# Patient Record
Sex: Male | Born: 1955 | Race: White | Hispanic: No
Health system: Midwestern US, Community
[De-identification: ages and names within clinical notes are randomized; demographics above are authoritative.]

## PROBLEM LIST (undated history)

## (undated) DIAGNOSIS — R1011 Right upper quadrant pain: Secondary | ICD-10-CM

## (undated) DIAGNOSIS — Z01818 Encounter for other preprocedural examination: Secondary | ICD-10-CM

---

## 2015-09-08 ENCOUNTER — Ambulatory Visit: Admit: 2015-09-08 | Discharge: 2015-09-08 | Payer: PRIVATE HEALTH INSURANCE | Attending: Surgery | Primary: Specialist

## 2015-09-08 DIAGNOSIS — R1011 Right upper quadrant pain: Secondary | ICD-10-CM

## 2015-09-08 NOTE — Progress Notes (Signed)
1. Have you been to the ER, urgent care clinic since your last visit?  Hospitalized since your last visit?  [facility] ED for abd pain    2. Have you seen or consulted any other health care providers outside of the [facility] since your last visit?  Include any pap smears or colon screening.   no

## 2015-09-08 NOTE — Progress Notes (Signed)
[facility] History and Physical    History of Present Illness:      John [name] is a 60 y.o. male who has been having some RUQ abdominal pain.  The pain is worse after eating.  Sometimes worse after a big meal.  The pain is a 4 of 10.  He does have some nausea but no vomiting.  He has been a little more constipated recently.  He has been to the ED at [facility] 2 times over the past 2 weeks.  His work up each time was completely normal, the only thing that was seen is the small bowel was in the area of the liver a little higher than it normally is but had no signs of obstruction or masses.    Past Medical History   Diagnosis Date   ??? Hypertension        No past surgical history on file.      Current Outpatient Prescriptions:   ???  lisinopril (PRINIVIL, ZESTRIL) 20 mg tablet, TAKE 1 TABLET BY MOUTH EVERY DAY, Disp: , Rfl: 11  ???  AMBIEN CR 12.5 mg tablet, TAKE 1 TABLET BY MOUTH AT BEDTIME AS NEEDED, Disp: , Rfl: 3  ???  atorvastatin (LIPITOR) 40 mg tablet, TAKE 1 TABLET EVERY DAY, Disp: , Rfl: 3  ???  BELSOMRA 10 mg tablet, TAKE 1 TABLET AT BEDTIME FOR INSOMNIA, Disp: , Rfl: 0    No Known Allergies    Social History     Social History   ??? Marital status: MARRIED     Spouse name: N/A   ??? Number of children: N/A   ??? Years of education: N/A     Occupational History   ??? Not on file.     Social History Main Topics   ??? Smoking status: Current Every Day Smoker   ??? Smokeless tobacco: Not on file   ??? Alcohol use Yes   ??? Drug use: Not on file   ??? Sexual activity: Not on file     Other Topics Concern   ??? Not on file     Social History Narrative       Family History   Problem Relation Age of Onset   ??? Cancer Father    ??? Cancer Maternal Grandmother    ??? Heart Disease Maternal Grandmother        ROS   Constitutional: negative  Ears, Nose, Mouth, Throat, and Face: negative  Respiratory: negative  Cardiovascular: negative  Gastrointestinal: positive for nausea, constipation and abdominal pain  Genitourinary:negative   Integument/Breast: negative  Hematologic/Lymphatic: negative  Behavioral/Psychiatric: negative  Allergic/Immunologic: negative      Physical Exam:     Visit Vitals   ??? BP 126/70   ??? Pulse 79   ??? Temp 97.9 ??F (36.6 ??C)   ??? Resp 16   ??? Ht 6\' 2"  (1.88 m)   ??? Wt 198 lb (89.8 kg)   ??? SpO2 98%   ??? BMI 25.42 kg/m2       General - alert and oriented, no apparent distress  HEENT - no jaundice, no hearing imparement  Pulm - CTAB, no C/W/R  CV - RRR, no M/R/G  Abd - soft, ND, BS present, mild TTP in RUQ, no guarding  Ext - pulses intact in UE and LE bilaterally, no edema  Skin - supple, no rashes  Psychiatric - normal affect, good mood    Labs  none    Imaging  Images from 2  CTs at [facility] show no obstruction, normal small bowel but some small bowel in the RUQ, no stones in the GB  I have reviewed and agree with all of the pertinent images    Assessment:     John [name] is a 60 y.o. male with RUQ pain    Recommendations:     1.   The pain he is having could be from biliary dyskinesia.  I do not think the pain is related to the small bowel in the RUQ.  This is an abnormal location to see it but the bowel is normal in appearance, no obstruction, inflammation or signs of twisting.  His pain seems to be possibly biliary in origin with pain with eating.  He will get a HIDA with [facility] to see his GB EF.  If it is abnormal he will then get laparoscopic cholecystectomy.  If normal then referral to GI.  I will call him with the results of the HIDA.    Debbe Bales, MD    Mr. John [name] has a reminder for a "due or due soon" health maintenance. I have asked that he contact his primary care provider for follow-up on this health maintenance.

## 2015-09-13 ENCOUNTER — Ambulatory Visit: Payer: PRIVATE HEALTH INSURANCE | Primary: Specialist

## 2015-09-27 ENCOUNTER — Encounter

## 2015-09-28 ENCOUNTER — Inpatient Hospital Stay: Admit: 2015-09-28 | Payer: PRIVATE HEALTH INSURANCE | Attending: Surgery | Primary: Specialist

## 2015-09-28 DIAGNOSIS — R1011 Right upper quadrant pain: Secondary | ICD-10-CM

## 2015-09-28 MED ORDER — SINCALIDE 5 MCG IJ SOLR
5 mcg | Freq: Once | INTRAMUSCULAR | Status: AC
Start: 2015-09-28 — End: 2015-09-28
  Administered 2015-09-28: 19:00:00 via INTRAVENOUS

## 2015-09-28 MED ORDER — SODIUM CHLORIDE 0.9 % IJ SYRG
Freq: Once | INTRAMUSCULAR | Status: AC
Start: 2015-09-28 — End: 2015-09-28
  Administered 2015-09-28: 18:00:00 via INTRAVENOUS

## 2015-09-28 MED ORDER — SODIUM CHLORIDE 0.9 % IV
Freq: Once | INTRAVENOUS | Status: AC
Start: 2015-09-28 — End: 2015-09-28
  Administered 2015-09-28: 19:00:00 via INTRAVENOUS

## 2015-09-28 MED ORDER — TECHNETIUM TC 99M MEBROFENIN
Freq: Once | Status: AC
Start: 2015-09-28 — End: 2015-09-28
  Administered 2015-09-28: 18:00:00 via INTRAVENOUS

## 2015-09-28 MED FILL — KINEVAC 5 MCG SOLUTION FOR INJECTION: 5 mcg | INTRAMUSCULAR | Qty: 5

## 2015-09-28 MED FILL — SODIUM CHLORIDE 0.9 % IV: INTRAVENOUS | Qty: 25

## 2015-09-28 MED FILL — TECHNETIUM TC 99M MEBROFENIN: Qty: 6

## 2015-09-28 MED FILL — BD POSIFLUSH NORMAL SALINE 0.9 % INJECTION SYRINGE: INTRAMUSCULAR | Qty: 10

## 2015-09-29 ENCOUNTER — Inpatient Hospital Stay: Admit: 2015-09-29 | Payer: PRIVATE HEALTH INSURANCE | Attending: Body Imaging | Primary: Specialist

## 2015-09-29 DIAGNOSIS — R1011 Right upper quadrant pain: Secondary | ICD-10-CM

## 2015-09-30 NOTE — Telephone Encounter (Signed)
Message given to Dr Lee.

## 2017-08-01 ENCOUNTER — Inpatient Hospital Stay: Admit: 2017-08-01 | Payer: PRIVATE HEALTH INSURANCE | Primary: Specialist

## 2017-08-01 ENCOUNTER — Encounter

## 2017-08-01 DIAGNOSIS — Z01818 Encounter for other preprocedural examination: Secondary | ICD-10-CM

## 2017-08-01 LAB — URINALYSIS W/MICROSCOPIC
Bacteria: NEGATIVE /hpf
Bilirubin: NEGATIVE
Blood: NEGATIVE
Glucose: NEGATIVE mg/dL
Ketone: NEGATIVE mg/dL
Leukocyte Esterase: NEGATIVE
Nitrites: NEGATIVE
Protein: NEGATIVE mg/dL
Specific gravity: 1.007 (ref 1.003–1.030)
Urobilinogen: 0.2 EU/dL (ref 0.2–1.0)
pH (UA): 6 (ref 5.0–8.0)

## 2017-08-01 LAB — CBC WITH AUTOMATED DIFF
ABS. BASOPHILS: 0 10*3/uL (ref 0.0–0.1)
ABS. EOSINOPHILS: 0.1 10*3/uL (ref 0.0–0.4)
ABS. IMM. GRANS.: 0 10*3/uL (ref 0.00–0.04)
ABS. LYMPHOCYTES: 1.3 10*3/uL (ref 0.8–3.5)
ABS. MONOCYTES: 0.5 10*3/uL (ref 0.0–1.0)
ABS. NEUTROPHILS: 3.1 10*3/uL (ref 1.8–8.0)
ABSOLUTE NRBC: 0 10*3/uL (ref 0.00–0.01)
BASOPHILS: 1 % (ref 0–1)
EOSINOPHILS: 2 % (ref 0–7)
HCT: 39.4 % (ref 36.6–50.3)
HGB: 12.5 g/dL (ref 12.1–17.0)
IMMATURE GRANULOCYTES: 0 % (ref 0.0–0.5)
LYMPHOCYTES: 26 % (ref 12–49)
MCH: 30.3 PG (ref 26.0–34.0)
MCHC: 31.7 g/dL (ref 30.0–36.5)
MCV: 95.4 FL (ref 80.0–99.0)
MONOCYTES: 10 % (ref 5–13)
MPV: 11.8 FL (ref 8.9–12.9)
NEUTROPHILS: 61 % (ref 32–75)
NRBC: 0 PER 100 WBC
PLATELET: 144 10*3/uL — ABNORMAL LOW (ref 150–400)
RBC: 4.13 M/uL (ref 4.10–5.70)
RDW: 13.1 % (ref 11.5–14.5)
WBC: 5 10*3/uL (ref 4.1–11.1)

## 2017-08-01 LAB — METABOLIC PANEL, COMPREHENSIVE
A-G Ratio: 1.6 (ref 1.1–2.2)
ALT (SGPT): 23 U/L (ref 12–78)
AST (SGOT): 11 U/L — ABNORMAL LOW (ref 15–37)
Albumin: 4.1 g/dL (ref 3.5–5.0)
Alk. phosphatase: 38 U/L — ABNORMAL LOW (ref 45–117)
Anion gap: 7 mmol/L (ref 5–15)
BUN/Creatinine ratio: 16 (ref 12–20)
BUN: 14 MG/DL (ref 6–20)
Bilirubin, total: 0.6 MG/DL (ref 0.2–1.0)
CO2: 30 mmol/L (ref 21–32)
Calcium: 9 MG/DL (ref 8.5–10.1)
Chloride: 104 mmol/L (ref 97–108)
Creatinine: 0.85 MG/DL (ref 0.70–1.30)
GFR est AA: >60 mL/min/{1.73_m2} (ref >60)
GFR est non-AA: >60 mL/min/{1.73_m2} (ref >60)
Globulin: 2.6 g/dL (ref 2.0–4.0)
Glucose: 99 mg/dL (ref 65–100)
Potassium: 3.9 mmol/L (ref 3.5–5.1)
Protein, total: 6.7 g/dL (ref 6.4–8.2)
Sodium: 141 mmol/L (ref 136–145)

## 2017-08-01 LAB — PTT: aPTT: 28.9 s (ref 22.1–32.0)

## 2017-08-01 LAB — PROTHROMBIN TIME + INR
INR: 1 (ref 0.9–1.1)
Prothrombin time: 10.3 s (ref 9.0–11.1)

## 2017-08-01 LAB — TYPE + CROSSMATCH
ABO/Rh(D): B NEG
Antibody screen: NEGATIVE

## 2017-08-01 MED ORDER — SODIUM CHLORIDE 0.9 % IV
INTRAVENOUS | Status: DC | PRN
Start: 2017-08-01 — End: 2017-08-05

## 2017-08-01 MED FILL — SODIUM CHLORIDE 0.9 % IV: INTRAVENOUS | Qty: 250

## 2017-08-01 NOTE — Other (Signed)
Patient given surgical site infection FAQ handout and CHG wipes. Preop instructions reviewed and patient verbalizes understanding of instructions. Patient has been given the opportunity to ask additional questions.

## 2017-08-02 LAB — CULTURE, URINE
Colonies Counted: <1000
Colony Count: <1000
Culture result:: NO GROWTH
Culture: NO GROWTH

## 2017-08-02 LAB — EKG 12-LEAD
Atrial Rate: 50 {beats}/min
P Axis: 29 degrees
P-R Interval: 204 ms
Q-T Interval: 426 ms
QRS Duration: 94 ms
QTc Calculation (Bazett): 388 ms
R Axis: 52 degrees
T Axis: 40 degrees
Ventricular Rate: 50 {beats}/min

## 2017-08-02 LAB — EKG, 12 LEAD, INITIAL
Atrial Rate: 50 {beats}/min
Calculated P Axis: 29 degrees
Calculated R Axis: 52 degrees
Calculated T Axis: 40 degrees
P-R Interval: 204 ms
Q-T Interval: 426 ms
QRS Duration: 94 ms
QTC Calculation (Bezet): 388 ms
Ventricular Rate: 50 {beats}/min

## 2017-08-02 NOTE — Other (Signed)
FAXED PRE-ADMISSION TESTING REPORTS (AND FAX CONFIRMATION RECEIVED TO DR. Maxie Barb OFFICE CALLED ON 08/02/17  AT 1322  AND SPOKE WITH SECRETARY WHO SAID THE PLATELET COUNT HAD BEEN SCANNED INTO PATIENTS MEDICAL RECORD , RE: ABNORMAL PLATELETS 144 (L).

## 2017-08-15 ENCOUNTER — Inpatient Hospital Stay
Admit: 2017-08-15 | Discharge: 2017-08-19 | Disposition: A | Discharge status: Home / Self Care | Payer: PRIVATE HEALTH INSURANCE | Attending: Urology | Admitting: Urology

## 2017-08-15 DIAGNOSIS — C642 Malignant neoplasm of left kidney, except renal pelvis: Secondary | ICD-10-CM

## 2017-08-15 LAB — HGB & HCT
HCT: 38.8 % (ref 36.6–50.3)
HGB: 12.3 g/dL (ref 12.1–17.0)

## 2017-08-15 MED ORDER — LACTATED RINGERS IV
INTRAVENOUS | Status: DC
Start: 2017-08-15 — End: 2017-08-15
  Administered 2017-08-15: 16:00:00 via INTRAVENOUS

## 2017-08-15 MED ORDER — CLONAZEPAM 1 MG TAB
1 mg | Freq: Every evening | ORAL | Status: DC
Start: 2017-08-15 — End: 2017-08-19
  Administered 2017-08-16 – 2017-08-18 (×2): via ORAL

## 2017-08-15 MED ORDER — SUGAMMADEX 100 MG/ML INTRAVENOUS SOLUTION
100 mg/mL | INTRAVENOUS | Status: DC
Start: 2017-08-15 — End: 2017-08-15
  Administered 2017-08-15: 16:00:00 via INTRAVENOUS

## 2017-08-15 MED ORDER — LINACLOTIDE 290 MCG CAPSULE
290 mcg | Freq: Every day | ORAL | Status: DC
Start: 2017-08-15 — End: 2017-08-19
  Administered 2017-08-17 – 2017-08-19 (×3): via ORAL

## 2017-08-15 MED ORDER — DEXAMETHASONE SODIUM PHOSPHATE 4 MG/ML IJ SOLN
4 mg/mL | INTRAMUSCULAR | Status: DC | PRN
Start: 2017-08-15 — End: 2017-08-15
  Administered 2017-08-15: 13:00:00 via INTRAVENOUS

## 2017-08-15 MED ORDER — ONDANSETRON (PF) 4 MG/2 ML INJECTION
4 mg/2 mL | INTRAMUSCULAR | Status: DC | PRN
Start: 2017-08-15 — End: 2017-08-15

## 2017-08-15 MED ORDER — ACETAMINOPHEN 1,000 MG/100 ML (10 MG/ML) IV
1000 mg/100 mL (10 mg/mL) | INTRAVENOUS | Status: DC | PRN
Start: 2017-08-15 — End: 2017-08-15
  Administered 2017-08-15: 13:00:00 via INTRAVENOUS

## 2017-08-15 MED ORDER — BUPIVACAINE (PF) 0.25 % (2.5 MG/ML) IJ SOLN
0.25 % (2.5 mg/mL) | INTRAMUSCULAR | Status: DC | PRN
Start: 2017-08-15 — End: 2017-08-15
  Administered 2017-08-15: 14:00:00 via SUBCUTANEOUS

## 2017-08-15 MED ORDER — PROPOFOL 10 MG/ML IV EMUL
10 mg/mL | INTRAVENOUS | Status: DC | PRN
Start: 2017-08-15 — End: 2017-08-15
  Administered 2017-08-15: 13:00:00 via INTRAVENOUS

## 2017-08-15 MED ORDER — HYDROMORPHONE (PF) 2 MG/ML IJ SOLN
2 mg/mL | INTRAMUSCULAR | Status: DC | PRN
Start: 2017-08-15 — End: 2017-08-15
  Administered 2017-08-15 (×2): via INTRAVENOUS

## 2017-08-15 MED ORDER — MIDAZOLAM 1 MG/ML IJ SOLN
1 mg/mL | INTRAMUSCULAR | Status: AC
Start: 2017-08-15 — End: ?

## 2017-08-15 MED ORDER — BACITRACIN 50,000 UNIT IM
50000 unit | INTRAMUSCULAR | Status: DC | PRN
Start: 2017-08-15 — End: 2017-08-15
  Administered 2017-08-15: 14:00:00

## 2017-08-15 MED ORDER — TRAZODONE 100 MG TAB
100 mg | Freq: Every evening | ORAL | Status: DC
Start: 2017-08-15 — End: 2017-08-15

## 2017-08-15 MED ORDER — SODIUM CHLORIDE 0.9 % IJ SYRG
INTRAMUSCULAR | Status: DC | PRN
Start: 2017-08-15 — End: 2017-08-15

## 2017-08-15 MED ORDER — CEFAZOLIN 2 GRAM/20 ML IN STERILE WATER INTRAVENOUS SYRINGE
2 gram/0 mL | INTRAVENOUS | Status: AC
Start: 2017-08-15 — End: 2017-08-15
  Administered 2017-08-15: 13:00:00 via INTRAVENOUS

## 2017-08-15 MED ORDER — MIDAZOLAM 1 MG/ML IJ SOLN
1 mg/mL | INTRAMUSCULAR | Status: DC | PRN
Start: 2017-08-15 — End: 2017-08-15

## 2017-08-15 MED ORDER — CLONAZEPAM 1 MG TAB
1 mg | Freq: Every evening | ORAL | Status: DC
Start: 2017-08-15 — End: 2017-08-15

## 2017-08-15 MED ORDER — FENTANYL CITRATE (PF) 50 MCG/ML IJ SOLN
50 mcg/mL | INTRAMUSCULAR | Status: DC | PRN
Start: 2017-08-15 — End: 2017-08-15
  Administered 2017-08-15 (×2): via INTRAVENOUS

## 2017-08-15 MED ORDER — LACTATED RINGERS IV
INTRAVENOUS | Status: DC
Start: 2017-08-15 — End: 2017-08-15
  Administered 2017-08-15: 12:00:00 via INTRAVENOUS

## 2017-08-15 MED ORDER — MANNITOL 25 % IV SOLN
25 % | INTRAVENOUS | Status: DC | PRN
Start: 2017-08-15 — End: 2017-08-15
  Administered 2017-08-15: 14:00:00 via INTRAVENOUS

## 2017-08-15 MED ORDER — SODIUM CHLORIDE 0.9 % IJ SYRG
Freq: Three times a day (TID) | INTRAMUSCULAR | Status: DC
Start: 2017-08-15 — End: 2017-08-19
  Administered 2017-08-15 – 2017-08-19 (×13): via INTRAVENOUS

## 2017-08-15 MED ORDER — HYDROMORPHONE (PF) 2 MG/ML IJ SOLN
2 mg/mL | INTRAMUSCULAR | Status: AC
Start: 2017-08-15 — End: ?

## 2017-08-15 MED ORDER — FENTANYL CITRATE (PF) 50 MCG/ML IJ SOLN
50 mcg/mL | INTRAMUSCULAR | Status: DC | PRN
Start: 2017-08-15 — End: 2017-08-15

## 2017-08-15 MED ORDER — PHENYLEPHRINE IN 0.9 % SODIUM CL (40 MCG/ML) IV SYRINGE
0.4 mg/10 mL (40 mcg/mL) | INTRAVENOUS | Status: DC | PRN
Start: 2017-08-15 — End: 2017-08-15
  Administered 2017-08-15 (×2): via INTRAVENOUS

## 2017-08-15 MED ORDER — PANTOPRAZOLE 40 MG GRANULES FOR ORAL SUSP, DELAYED RELEASE
40 mg | Freq: Every day | ORAL | Status: DC
Start: 2017-08-15 — End: 2017-08-15

## 2017-08-15 MED ORDER — TRAZODONE 50 MG TAB
50 mg | Freq: Every evening | ORAL | Status: DC
Start: 2017-08-15 — End: 2017-08-19
  Administered 2017-08-16 – 2017-08-19 (×5): via ORAL

## 2017-08-15 MED ORDER — SODIUM CHLORIDE 0.9 % IJ SYRG
Freq: Three times a day (TID) | INTRAMUSCULAR | Status: DC
Start: 2017-08-15 — End: 2017-08-15
  Administered 2017-08-15 (×2): via INTRAVENOUS

## 2017-08-15 MED ORDER — NALOXONE 0.4 MG/ML INJECTION
0.4 mg/mL | INTRAMUSCULAR | Status: DC | PRN
Start: 2017-08-15 — End: 2017-08-19

## 2017-08-15 MED ORDER — GLYCOPYRROLATE 0.2 MG/ML IJ SOLN
0.2 mg/mL | INTRAMUSCULAR | Status: DC | PRN
Start: 2017-08-15 — End: 2017-08-15
  Administered 2017-08-15: 13:00:00 via INTRAVENOUS

## 2017-08-15 MED ORDER — SUCCINYLCHOLINE CHLORIDE 20 MG/ML INJECTION
20 mg/mL | INTRAMUSCULAR | Status: DC | PRN
Start: 2017-08-15 — End: 2017-08-15
  Administered 2017-08-15: 13:00:00 via INTRAVENOUS

## 2017-08-15 MED ORDER — PANTOPRAZOLE 40 MG TAB, DELAYED RELEASE
40 mg | Freq: Every day | ORAL | Status: DC
Start: 2017-08-15 — End: 2017-08-19
  Administered 2017-08-16 – 2017-08-19 (×4): via ORAL

## 2017-08-15 MED ORDER — MIDAZOLAM 1 MG/ML IJ SOLN
1 mg/mL | INTRAMUSCULAR | Status: DC | PRN
Start: 2017-08-15 — End: 2017-08-15
  Administered 2017-08-15: 12:00:00 via INTRAVENOUS

## 2017-08-15 MED ORDER — PHENYLEPHRINE 10 MG/ML INJECTION
10 mg/mL | INTRAMUSCULAR | Status: DC | PRN
Start: 2017-08-15 — End: 2017-08-15
  Administered 2017-08-15: 13:00:00 via INTRAVENOUS

## 2017-08-15 MED ORDER — FENTANYL CITRATE (PF) 50 MCG/ML IJ SOLN
50 mcg/mL | INTRAMUSCULAR | Status: AC
Start: 2017-08-15 — End: ?

## 2017-08-15 MED ORDER — SODIUM CHLORIDE 0.9 % IJ SYRG
INTRAMUSCULAR | Status: DC | PRN
Start: 2017-08-15 — End: 2017-08-19

## 2017-08-15 MED ORDER — BUPIVACAINE-EPINEPHRINE (PF) 0.25 %-1:200,000 IJ SOLN
0.25 %-1:200,000 | INTRAMUSCULAR | Status: AC
Start: 2017-08-15 — End: ?

## 2017-08-15 MED ORDER — HYDROCODONE-ACETAMINOPHEN 5 MG-325 MG TAB
5-325 mg | ORAL | Status: DC | PRN
Start: 2017-08-15 — End: 2017-08-16
  Administered 2017-08-15: 20:00:00 via ORAL

## 2017-08-15 MED ORDER — SODIUM CHLORIDE 0.9 % IV
INTRAVENOUS | Status: DC | PRN
Start: 2017-08-15 — End: 2017-08-15
  Administered 2017-08-15: 15:00:00 via INTRAVENOUS

## 2017-08-15 MED ORDER — SODIUM CHLORIDE 0.9 % IV
INTRAVENOUS | Status: DC
Start: 2017-08-15 — End: 2017-08-15
  Administered 2017-08-15 (×2): via INTRAVENOUS

## 2017-08-15 MED ORDER — SUGAMMADEX 100 MG/ML INTRAVENOUS SOLUTION
100 mg/mL | INTRAVENOUS | Status: DC | PRN
Start: 2017-08-15 — End: 2017-08-15
  Administered 2017-08-15: 15:00:00 via INTRAVENOUS

## 2017-08-15 MED ORDER — KETAMINE 10 MG/ML IJ SOLN
10 mg/mL | INTRAMUSCULAR | Status: DC | PRN
Start: 2017-08-15 — End: 2017-08-15
  Administered 2017-08-15 (×3): via INTRAVENOUS

## 2017-08-15 MED ORDER — FENTANYL CITRATE (PF) 50 MCG/ML IJ SOLN
50 mcg/mL | INTRAMUSCULAR | Status: AC | PRN
Start: 2017-08-15 — End: 2017-08-15
  Administered 2017-08-15 (×4): via INTRAVENOUS

## 2017-08-15 MED ORDER — LIDOCAINE (PF) 20 MG/ML (2 %) IJ SOLN
20 mg/mL (2 %) | INTRAMUSCULAR | Status: DC | PRN
Start: 2017-08-15 — End: 2017-08-15
  Administered 2017-08-15: 13:00:00 via INTRAVENOUS

## 2017-08-15 MED ORDER — ONDANSETRON (PF) 4 MG/2 ML INJECTION
4 mg/2 mL | INTRAMUSCULAR | Status: DC | PRN
Start: 2017-08-15 — End: 2017-08-15
  Administered 2017-08-15: 13:00:00 via INTRAVENOUS

## 2017-08-15 MED ORDER — ROPIVACAINE (PF) 5 MG/ML (0.5 %) INJECTION
5 mg/mL (0. %) | INTRAMUSCULAR | Status: DC | PRN
Start: 2017-08-15 — End: 2017-08-15

## 2017-08-15 MED ORDER — MORPHINE 10 MG/ML INJ SOLUTION
10 mg/ml | INTRAMUSCULAR | Status: DC | PRN
Start: 2017-08-15 — End: 2017-08-15
  Administered 2017-08-15 (×5): via INTRAVENOUS

## 2017-08-15 MED ORDER — LISINOPRIL 10 MG TAB
10 mg | Freq: Every day | ORAL | Status: DC
Start: 2017-08-15 — End: 2017-08-19
  Administered 2017-08-16 – 2017-08-17 (×2): via ORAL

## 2017-08-15 MED ORDER — ROCURONIUM 10 MG/ML IV
10 mg/mL | INTRAVENOUS | Status: DC | PRN
Start: 2017-08-15 — End: 2017-08-15
  Administered 2017-08-15 (×4): via INTRAVENOUS

## 2017-08-15 MED ORDER — FENTANYL (PF) 900 MCG/30 ML PCA (ADULT)
900 mcg/30 mL | INTRAMUSCULAR | Status: DC
Start: 2017-08-15 — End: 2017-08-17
  Administered 2017-08-16 (×2): via INTRAVENOUS

## 2017-08-15 MED ORDER — ACETAMINOPHEN 1,000 MG/100 ML (10 MG/ML) IV
1000 mg/100 mL (10 mg/mL) | Freq: Four times a day (QID) | INTRAVENOUS | Status: AC
Start: 2017-08-15 — End: 2017-08-16
  Administered 2017-08-15 – 2017-08-16 (×5): via INTRAVENOUS

## 2017-08-15 MED ORDER — DEXMEDETOMIDINE 400 MCG/100 ML (4 MCG/ML) IN 0.9 % SODIUM CHLORIDE IV
400 mcg/100 mL (4 mcg/mL) | INTRAVENOUS | Status: DC | PRN
Start: 2017-08-15 — End: 2017-08-15
  Administered 2017-08-15 (×2): via INTRAVENOUS

## 2017-08-15 MED ORDER — SODIUM CHLORIDE 0.9 % IV
Freq: Once | INTRAVENOUS | Status: AC
Start: 2017-08-15 — End: 2017-08-15
  Administered 2017-08-15: 16:00:00 via INTRAVENOUS

## 2017-08-15 MED ORDER — LACTATED RINGERS IV
INTRAVENOUS | Status: DC
Start: 2017-08-15 — End: 2017-08-15
  Administered 2017-08-15: 13:00:00 via INTRAVENOUS

## 2017-08-15 MED ORDER — KETAMINE 50 MG/5 ML (10 MG/ML) IN NS IV SYRINGE
50 mg/5 mL (10 mg/mL) | INTRAVENOUS | Status: AC
Start: 2017-08-15 — End: ?

## 2017-08-15 MED ORDER — FENTANYL (PF) 900 MCG/30 ML PCA (ADULT)
900 mcg/30 mL | INTRAMUSCULAR | Status: DC
Start: 2017-08-15 — End: 2017-08-15

## 2017-08-15 MED ORDER — DEXTROSE 5%-1/2 NORMAL SALINE IV
INTRAVENOUS | Status: DC
Start: 2017-08-15 — End: 2017-08-18
  Administered 2017-08-15 – 2017-08-18 (×8): via INTRAVENOUS

## 2017-08-15 MED ORDER — LIDOCAINE (PF) 10 MG/ML (1 %) IJ SOLN
10 mg/mL (1 %) | INTRAMUSCULAR | Status: DC | PRN
Start: 2017-08-15 — End: 2017-08-15

## 2017-08-15 MED FILL — KETAMINE 10 MG/ML IJ SOLN: 10 mg/mL | INTRAMUSCULAR | Qty: 5

## 2017-08-15 MED FILL — BRIDION 100 MG/ML INTRAVENOUS SOLUTION: 100 mg/mL | INTRAVENOUS | Qty: 1.9

## 2017-08-15 MED FILL — CEFAZOLIN 2 GRAM/20 ML IN STERILE WATER INTRAVENOUS SYRINGE: 2 gram/0 mL | INTRAVENOUS | Qty: 20

## 2017-08-15 MED FILL — PHENYLEPHRINE IN 0.9 % SODIUM CL (40 MCG/ML) IV SYRINGE: 0.4 mg/10 mL (40 mcg/mL) | INTRAVENOUS | Qty: 10

## 2017-08-15 MED FILL — OFIRMEV 1,000 MG/100 ML (10 MG/ML) INTRAVENOUS SOLUTION: 1000 mg/100 mL (10 mg/mL) | INTRAVENOUS | Qty: 100

## 2017-08-15 MED FILL — FENTANYL (PF) 900 MCG/30 ML INFUSION: 900 mcg/30 mL | INTRAMUSCULAR | Qty: 30

## 2017-08-15 MED FILL — DIPRIVAN 10 MG/ML INTRAVENOUS EMULSION: 10 mg/mL | INTRAVENOUS | Qty: 20

## 2017-08-15 MED FILL — PHENYLEPHRINE 10 MG/ML INJECTION: 10 mg/mL | INTRAMUSCULAR | Qty: 10

## 2017-08-15 MED FILL — FENTANYL CITRATE (PF) 50 MCG/ML IJ SOLN: 50 mcg/mL | INTRAMUSCULAR | Qty: 2

## 2017-08-15 MED FILL — LACTATED RINGERS IV: INTRAVENOUS | Qty: 1000

## 2017-08-15 MED FILL — MANNITOL 25 % IV SOLN: 25 % | INTRAVENOUS | Qty: 50

## 2017-08-15 MED FILL — MIDAZOLAM 1 MG/ML IJ SOLN: 1 mg/mL | INTRAMUSCULAR | Qty: 2

## 2017-08-15 MED FILL — ROCURONIUM 10 MG/ML IV: 10 mg/mL | INTRAVENOUS | Qty: 7

## 2017-08-15 MED FILL — GLYCOPYRROLATE 0.6 MG/3 ML (0.2 MG/ML) INTRAVENOUS SYRINGE: 0.6 mg/3 mL (0.2 mg/mL) | INTRAVENOUS | Qty: 1

## 2017-08-15 MED FILL — MORPHINE 10 MG/ML IJ SOLN: 10 mg/mL | INTRAMUSCULAR | Qty: 1

## 2017-08-15 MED FILL — HYDROCODONE-ACETAMINOPHEN 5 MG-325 MG TAB: 5-325 mg | ORAL | Qty: 1

## 2017-08-15 MED FILL — ONDANSETRON (PF) 4 MG/2 ML INJECTION: 4 mg/2 mL | INTRAMUSCULAR | Qty: 2

## 2017-08-15 MED FILL — DEXAMETHASONE SODIUM PHOSPHATE 4 MG/ML IJ SOLN: 4 mg/mL | INTRAMUSCULAR | Qty: 1

## 2017-08-15 MED FILL — XYLOCAINE-MPF 20 MG/ML (2 %) INJECTION SOLUTION: 20 mg/mL (2 %) | INTRAMUSCULAR | Qty: 5

## 2017-08-15 MED FILL — KETAMINE 50 MG/5 ML (10 MG/ML) IN NS IV SYRINGE: 50 mg/5 mL (10 mg/mL) | INTRAVENOUS | Qty: 5

## 2017-08-15 MED FILL — BRIDION 100 MG/ML INTRAVENOUS SOLUTION: 100 mg/mL | INTRAVENOUS | Qty: 2

## 2017-08-15 MED FILL — NORMAL SALINE FLUSH 0.9 % INJECTION SYRINGE: INTRAMUSCULAR | Qty: 10

## 2017-08-15 MED FILL — PRECEDEX 100 MCG/ML INTRAVENOUS SOLUTION: 100 mcg/mL | INTRAVENOUS | Qty: 20

## 2017-08-15 MED FILL — MARCAINE-EPINEPHRINE (PF) 0.25 %-1:200,000 INJECTION SOLUTION: 0.25 %-1:200,000 | INTRAMUSCULAR | Qty: 10

## 2017-08-15 MED FILL — DEXTROSE 5%-1/2 NORMAL SALINE IV: INTRAVENOUS | Qty: 1000

## 2017-08-15 MED FILL — SODIUM CHLORIDE 0.9 % IV: INTRAVENOUS | Qty: 1000

## 2017-08-15 MED FILL — HYDROMORPHONE (PF) 2 MG/ML IJ SOLN: 2 mg/mL | INTRAMUSCULAR | Qty: 1

## 2017-08-15 NOTE — Op Note (Signed)
[facility] REPORT    Name:John [name], John [name]  MR#: [id number]  DOB: [birth year]  ACCOUNT #: [id number]   DATE OF SERVICE: 08/15/2017    PREOPERATIVE DIAGNOSIS:  Left renal mass.    POSTOPERATIVE DIAGNOSIS:  Left renal mass.    PROCEDURE PERFORMED:  Left intrarenal partial nephrectomy.    COMPLICATIONS:  None.    ESTIMATED BLOOD LOSS:  300 mL    SURGEON:  Cecilio Asper, MD    ASSISTANT:  Glynn Octave, MD    SPECIMENS REMOVED:  Left renal mass.    IMPLANTS:  None.    ANESTHESIA:  General.    PROCEDURE PERFORMED:  After anesthesia, the patient was prepped and draped in the flank position.  He was well hydrated.  A midline incision was made.  The hand port was inserted.  Three ports were placed in the left mid abdomen, and the left colon was reflected medially.  The patient had a history of splenic hematoma, and the splenic flexure was not disturbed and the spleen was not disrupted.  The kidney was dissected from perinephric fat, and the entire hilum was freed.  The ureter was carefully protected.  There was a large upper pole and lower pole cyst, and both of these were initially protected until the kidney was completely freed from surrounding tissues.  Intraoperative ultrasound was used to identify the mass in conjunction with a CT scan.  It was located in the mid kidney anteriorly and was 2.0-2.5 cm deep but about 4 cm in width.  Using the ultrasound, electrocautery was used to score the edges of the renal capsule around the mass.  Prior to clamping the artery, both cysts were resected, and cyst walls were sent for permanent section analysis.  No tumors or lesions were noted within either cyst, and the argon beam coagulator was used to gently treat the surface of the cyst cavity.  The renal artery and vein were clamped together with a bulldog clamp, and the lesion was resected sharply and sent for frozen section.  Frozen section showed that the margins were clear but close, and the  findings were consistent with a well-encapsulated renal cell carcinoma.  The defect was brought together by placing sutures from caudad to cephalad and closing the kidney defect upon itself.  After the first suture was placed, the clamp was removed with a cross clamp time of 8-1/2 minutes.  Argon beam coagulation was used on the bed of the resection, and FloSeal was injected into the defect.  Two additional sutures were placed to further bring the defect together, and this allowed for good quality hemostasis.   FloSeal was also applied to the surface of the cyst, and Surgicel was placed inside the upper pole cyst cavity.  A drain was brought through the more lateral of the 3 ports and sutured in position.  The abdominal wall was then closed with a running #1 PDS.  Skin edges were closed with skin staples.  The abdomen was reinsufflated and examined, and there was no evidence of active bleeding.  The closure appeared to be intact with no evidence of adherent tissue underneath.  The rest of the ports were pulled under vision and had been placed with a V-technique, which closed over nicely.  Marcaine was injected in all of the incisions, and skin edges were closed with skin staples.  The operating time was just under 2 hours.      Cecilio Asper, MD  [facility] / HN  D: 08/15/2017 10:34     T: 08/15/2017 11:58  JOB #: [id number]

## 2017-08-15 NOTE — Progress Notes (Signed)
Admission Medication Reconciliation:    Information obtained from:  patient, RxQuery    Comments/Recommendations:     Spoke with patient regarding John [name]'s medications. He was able to list all medication names, doses, and frequencies. He splits most of his medications in half, so the doses listed in each sig in the PTA medication list are his true doses.    The following changes were made to the PTA medication list:  1. Added melatonin  2. Adjusted the following:   - Clonazepam 0.5 mg qHS --> 1 mg qHS  - Pantoprazole granules --> tablets  - Trazodone --> 50 mg qHS       Allergies:  Oxycodone    Chief Complaint for this Admission:  No chief complaint on file.      Significant PMH/Disease States:   Past Medical History:   Diagnosis Date   ??? Hypertension        Prior to Admission Medications:   Prior to Admission Medications   Prescriptions Last Dose Informant Patient Reported? Taking?   clonazePAM (KLONOPIN) 1 mg tablet 08/14/2017 at Unknown time  Yes Yes   Sig: Take 1 mg by mouth nightly.   linaclotide (LINZESS) 290 mcg cap capsule 08/14/2017 at Unknown time  Yes Yes   Sig: Take 290 mcg by mouth daily.   lisinopril (PRINIVIL, ZESTRIL) 20 mg tablet 08/14/2017 at Unknown time  Yes Yes   Sig: Take 10 mg by mouth daily.   melatonin 3 mg tablet   Yes Yes   Sig: Take 6 mg by mouth nightly.   pantoprazole (PROTONIX) 40 mg tablet 08/15/2017 at Unknown time  Yes Yes   Sig: Take 40 mg by mouth daily.   therapeutic multivitamin (THERA) tablet 08/08/2017 at Unknown time  Yes Yes   Sig: Take 1 Tab by mouth daily.   traZODone (DESYREL) 100 mg tablet 08/14/2017 at Unknown time  Yes Yes   Sig: Take 50 mg by mouth nightly.      Facility-Administered Medications: None       Thank you for allowing me to participate in this patient's care. Please call the main pharmacy at [phone] or the med rec pharmacist at [phone] with any questions.    Chauncey Reading, PharmD

## 2017-08-15 NOTE — Other (Signed)
TRANSFER - OUT REPORT:    Verbal report given to Heather(name) on John [name]  being transferred to 520(unit) for routine post - op       Report consisted of patient???s Situation, Background, Assessment and   Recommendations(SBAR).     Time Pre op antibiotic given:8 am Ancef  Anesthesia Stop time: 10:34  Foley Present on Transfer to floor:  Order for Foley on Chart:yes  Discharge Prescriptions with Chart:no    Information from the following report(s) SBAR, Kardex, OR Summary, Procedure Summary, Intake/Output and MAR was reviewed with the receiving nurse.    Opportunity for questions and clarification was provided.     Is the patient on 02? NO       L/Min        Other     Is the patient on a monitor? NO    Is the nurse transporting with the patient? NO    Surgical Waiting Area notified of patient's transfer from PACU? no      The following personal items collected during your admission accompanied patient upon transfer:   Dental Appliance:    Vision:    Hearing Aid:    Jewelry:    Clothing: Clothing: (clothing bags(3), glasses to pacu; valuables to security)  Other Valuables:    Valuables sent to safe:

## 2017-08-15 NOTE — Brief Op Note (Signed)
BRIEF OPERATIVE NOTE    Date of Procedure: 08/15/2017   Preoperative Diagnosis: LEFT RENAL MASS  Postoperative Diagnosis: LEFT RENAL MASS    Procedure(s):  LAPAROSCOPIC HAND ASSISTED LEFT PARTIAL NEPHRECTOMY WITH INTRA OPERATIVE ULTRASOUND  Surgeon(s) and Role:     Cecilio Asper, MD - Primary     * Thad Ranger, MD - Assisting         Surgical Assistant: Berdie Ogren    Surgical Staff:  Circ-1: Leta Jungling, RN  Scrub Tech-1: Windell Moment  Scrub RN - Intern: Garner Nash, RN  Surg Asst-1: Julious Payer  Event Time In Time Out   Incision Start [phone]    Incision Close 1011      Anesthesia: General   Estimated Blood Loss: 300  Specimens:   ID Type Source Tests Collected by Time Destination   1 : UPPER POLE CYST WALL, LEFT KIDENY Fresh Kid[facility]  Cecilio Asper, MD 08/15/2017 [phone] Pathology   2 : LOWER POLE CYST WALL LEFT KIDNEY [facility]  Cecilio Asper, MD 08/15/2017 [phone] Pathology   3 : LEFT RENAL MASS; INK AT BASE; CHECK MARGIN Frozen Section Kidney  Cecilio Asper, MD 08/15/2017 [phone] Pathology      Findings: intra renal mass.  Margin neg on frozen   Complications: 0  Implants: * No implants in log *

## 2017-08-15 NOTE — Anesthesia Pre-Procedure Evaluation (Addendum)
Anesthetic History   No history of anesthetic complications            Review of Systems / Medical History  Patient summary reviewed, nursing notes reviewed and pertinent labs reviewed    Pulmonary  Within defined limits                 Neuro/Psych   Within defined limits           Cardiovascular  Within defined limits  Hypertension              Exercise tolerance: >4 METS     GI/Hepatic/Renal  Within defined limits              Endo/Other  Within defined limits      Cancer     Other Findings            Physical Exam    Airway  Mallampati: II  TM Distance: 4 - 6 cm  Neck ROM: normal range of motion   Mouth opening: Normal     Cardiovascular  Regular rate and rhythm,  S1 and S2 normal,  no murmur, click, rub, or gallop             Dental    Dentition: Caps/crowns     Pulmonary  Breath sounds clear to auscultation               Abdominal  GI exam deferred       Other Findings            Anesthetic Plan    ASA: 2  Anesthesia type: general          Induction: Intravenous  Anesthetic plan and risks discussed with: Patient

## 2017-08-15 NOTE — Other (Signed)
Update called to [facility] per Ludie's request. Left voice message.

## 2017-08-15 NOTE — Other (Signed)
FLOSEAL 10ML USED DURING PROCEDURE  LOT #[id number]`8``73; REF #[id number]

## 2017-08-15 NOTE — Anesthesia Post-Procedure Evaluation (Signed)
Procedure(s):  LAPAROSCOPIC HAND ASSISTED LEFT PARTIAL NEPHRECTOMY WITH INTRA OPERATIVE ULTRASOUND.    Anesthesia Post Evaluation        Patient location during evaluation: PACU  Note status: Adequate.  Level of consciousness: responsive to verbal stimuli and sleepy but conscious  Pain management: satisfactory to patient  Airway patency: patent  Anesthetic complications: no  Cardiovascular status: acceptable  Respiratory status: acceptable  Hydration status: acceptable  Comments: +Post-Anesthesia Evaluation and Assessment    Patient: John [name] MRN: [id number]  SSN: [id number]   Date of Birth: 1956/03/09  Age: 61 y.o.  Sex: male      I have evaluated the patient and the patient is stable and ready to be discharged from PACU .  Cardiovascular Function/Vital Signs    BP 119/68    Pulse 63    Temp 36.4 ??C (97.6 ??F)    Resp 14    Ht 6\' 3"  (1.905 m)    Wt 92.7 kg (204 lb 5.9 oz)    SpO2 99%    BMI 25.54 kg/m??     Patient is status post Procedure(s):  LAPAROSCOPIC HAND ASSISTED LEFT PARTIAL NEPHRECTOMY WITH INTRA OPERATIVE ULTRASOUND.    Nausea/Vomiting: Controlled.    Postoperative hydration reviewed and adequate.    Pain:  Pain Scale 1: Numeric (0 - 10) (08/15/17 1115)  Pain Intensity 1: 0 (08/15/17 0603)   Managed.    Neurological Status:   Neuro (WDL): Exceptions to WDL (08/15/17 1029)   At baseline.    Mental Status and Level of Consciousness: Arousable.    Pulmonary Status:   O2 Device: Nasal cannula (08/15/17 1033)   Adequate oxygenation and airway patent.    Complications related to anesthesia: None    Post-anesthesia assessment completed. No concerns.    Signed By: Irene Pap, MD    08/15/2017        Visit Vitals  BP 119/68   Pulse 63   Temp 36.4 ??C (97.6 ??F)   Resp 14   Ht 6\' 3"  (1.905 m)   Wt 92.7 kg (204 lb 5.9 oz)   SpO2 99%   BMI 25.54 kg/m??

## 2017-08-15 NOTE — H&P (Signed)
Urology History and Physical    Patient: John [name] 61 y.o. male     Referring Physician:  No ref. provider found    Chief Complaint: No chief complaint on file.      History of Present Illness: left intrarenal mass kidney for partial nx. See notes    History:    Past Medical History:   Diagnosis Date   ??? Hypertension      Family History   Problem Relation Age of Onset   ??? Cancer Father         PROSTATE   ??? Heart Disease Father    ??? Cancer Maternal Grandmother    ??? Heart Disease Maternal Grandmother    ??? Bipolar Disorder Mother    ??? Depression Mother    ??? Other Sister         AUTO ACCIDENT   ??? Drug Abuse Sister    ??? Arrhythmia Brother         A FIB   ??? Anesth Problems Neg Hx      Social History     Socioeconomic History   ??? Marital status: WIDOWED     Spouse name: Not on file   ??? Number of children: Not on file   ??? Years of education: Not on file   ??? Highest education level: Not on file   Social Needs   ??? Financial resource strain: Not on file   ??? Food insecurity - worry: Not on file   ??? Food insecurity - inability: Not on file   ??? Transportation needs - medical: Not on file   ??? Transportation needs - non-medical: Not on file   Occupational History   ??? Not on file   Tobacco Use   ??? Smoking status: Former Smoker     Packs/day: 0.50     Years: 36.00     Pack years: 18.00     Last attempt to quit: 08/01/2014     Years since quitting: 3.0   ??? Smokeless tobacco: Never Used   Substance and Sexual Activity   ??? Alcohol use: No     Frequency: Never   ??? Drug use: No   ??? Sexual activity: Not on file   Other Topics Concern   ??? Not on file   Social History Narrative   ??? Not on file       Allergies:   Allergies   Allergen Reactions   ??? Oxycodone Rash and Nausea and Vomiting     Swollen fingers       Current Medications:  Current Facility-Administered Medications   Medication Dose Route Frequency   ??? ceFAZolin (ANCEF) 2 g/20 mL in sterile water IV syringe  2 g IntraVENous ON CALL TO OR    ??? lactated Ringers infusion  25 mL/hr IntraVENous CONTINUOUS   ??? lactated Ringers infusion  100 mL/hr IntraVENous CONTINUOUS   ??? 0.9% sodium chloride infusion  25 mL/hr IntraVENous CONTINUOUS   ??? sodium chloride (NS) flush 5-10 mL  5-10 mL IntraVENous Q8H   ??? sodium chloride (NS) flush 5-10 mL  5-10 mL IntraVENous PRN   ??? lidocaine (PF) (XYLOCAINE) 10 mg/mL (1 %) injection 0.1 mL  0.1 mL SubCUTAneous PRN   ??? fentaNYL citrate (PF) injection 50 mcg  50 mcg IntraVENous PRN   ??? midazolam (VERSED) injection 1 mg  1 mg IntraVENous PRN   ??? midazolam (VERSED) injection 1 mg  1 mg IntraVENous PRN   ??? ropivacaine (PF) (NAROPIN) 5 mg/mL (  0.5 %) injection 150 mg  30 mL Intra artICUlar PRN   ??? sodium chloride (NS) flush 5-10 mL  5-10 mL IntraVENous Q8H   ??? sodium chloride (NS) flush 5-10 mL  5-10 mL IntraVENous PRN        Physical Exam:  Blood pressure 107/73, pulse (!) 51, temperature 98.5 ??F (36.9 ??C), resp. rate 14, height 6\' 3"  (1.905 m), weight 92.7 kg (204 lb 5.9 oz), SpO2 99 %.  GENERAL: alert, cooperative, no distress, appears stated age, LUNG: clear to auscultation bilaterally, HEART: regular rate and rhythm, S1, S2 normal, no murmur, click, rub or gallop, ABDOMEN: soft, non-tender. Bowel sounds normal. No masses,  no organomegaly, EXTREMITIES:  extremities normal, atraumatic, no cyanosis or edema    Findings/Diagnosis: renal mass    Alerts:    Hospital Problems  Date Reviewed: 09/10/2015    None          Laboratory:    No results for input(s): HGB, HCT, WBC, PLT, INR, BUN, CREA, K, CRCLT, HGBEXT, HCTEXT, PLTEXT in the last 72 hours.    No lab exists for component: PTT, PT, INREXT      Plan of Care/Planned Procedure:  Risks, benefits, and alternatives reviewed with patient and he agrees to proceed with the procedure.       Full dictated report to follow.

## 2017-08-15 NOTE — Op Note (Signed)
[facility] REPORT    Name:John [name], John [name]  MR#: [id number]  DOB: Jan 13, 1956  ACCOUNT #: [id number]   DATE OF SERVICE: 08/15/2017    PREOPERATIVE DIAGNOSIS:  Left renal mass.    POSTOPERATIVE DIAGNOSIS:  Left renal mass.    PROCEDURE PERFORMED:  Left intrarenal partial nephrectomy.    COMPLICATIONS:  None.    ESTIMATED BLOOD LOSS:  300 mL    SURGEON:  Cecilio Asper, MD    ASSISTANT:  Glynn Octave, MD    SPECIMENS REMOVED:  Left renal mass.    IMPLANTS:  None.    ANESTHESIA:  General.    PROCEDURE PERFORMED:  After anesthesia, the patient was prepped and draped in the flank position.  He was well hydrated.  A midline incision was made.  The hand port was inserted.  Three ports were placed in the left mid abdomen, and the left colon was reflected medially.  The patient had a history of splenic hematoma, and the splenic flexure was not disturbed and the spleen was not disrupted.  The kidney was dissected from perinephric fat, and the entire hilum was freed.  The ureter was carefully protected.  There was a large upper pole and lower pole cyst, and both of these were initially protected until the kidney was completely freed from surrounding tissues.  Intraoperative ultrasound was used to identify the mass in conjunction with a CT scan.  It was located in the mid kidney anteriorly and was 2.0-2.5 cm deep but about 4 cm in width.  Using the ultrasound, electrocautery was used to score the edges of the renal capsule around the mass.  Prior to clamping the artery, both cysts were resected, and cyst walls were sent for permanent section analysis.  No tumors or lesions were noted within either cyst, and the argon beam coagulator was used to gently treat the surface of the cyst cavity.  The renal artery and vein were clamped together with a bulldog clamp, and the lesion was resected sharply and sent for frozen section.  Frozen section showed that the margins were  clear but close, and the findings were consistent with a well-encapsulated renal cell carcinoma.  The defect was brought together by placing sutures from caudad to cephalad and closing the kidney defect upon itself.  After the first suture was placed, the clamp was removed with a cross clamp time of 8-1/2 minutes.  Argon beam coagulation was used on the bed of the resection, and FloSeal was injected into the defect.  Two additional sutures were placed to further bring the defect together, and this allowed for good quality hemostasis.   FloSeal was also applied to the surface of the cyst, and Surgicel was placed inside the upper pole cyst cavity.  A drain was brought through the more lateral of the 3 ports and sutured in position.  The abdominal wall was then closed with a running #1 PDS.  Skin edges were closed with skin staples.  The abdomen was reinsufflated and examined, and there was no evidence of active bleeding.  The closure appeared to be intact with no evidence of adherent tissue underneath.  The rest of the ports were pulled under vision and had been placed with a V-technique, which closed over nicely.  Marcaine was injected in all of the incisions, and skin edges were closed with skin staples.  The operating time was just under 2 hours.      Cecilio Asper, MD  WR[facility] / HN  D: 08/15/2017 10:34     T: 08/15/2017 11:58  JOB #: [id number]

## 2017-08-15 NOTE — Other (Signed)
Patient: John [name] MRN: [id number]  SSN: [id number]   Date of Birth: 01/31/1956  Age: 61 y.o.  Sex: male     Patient is status post Procedure(s):  LAPAROSCOPIC HAND ASSISTED POSSIBLE OPEN LEFT PARTIAL NEPHRECTOMY WITH INTRA OPERATIVE ULTRASOUND.    Surgeon(s) and Role:     * Cecilio Asper, MD - Primary     * Thad Ranger, MD - Assisting    Local/Dose/Irrigation: bacitracin irrigation  0.25% MARCAINE W 1:200,000 EPI, SEE MAR FOR DOSING                  Peripheral IV 08/15/17 Right Hand (Active)   Dressing Status Clean, dry, & intact 08/15/2017  6:27 AM   Dressing Type Transparent 08/15/2017  6:27 AM   Hub Color/Line Status Infusing 08/15/2017  6:27 AM            Airway - Endotracheal Tube 08/15/17 Oral (Active)                   Dressing/Packing:     Splint/Cast:  ]    Other:  No one waiting

## 2017-08-15 NOTE — Progress Notes (Addendum)
1800: Spoke with Dr. Lilia Pro about norco not helping the patients pain. Dr. Lilia Pro said the patient could go on a PCA pump, but that the patients allergy to oxycodone is a concern and to call pharmacy to see what they recommend.     1808: Spoke with pharmacy and they stated that a fentanyl PCA pump should be fine.     1810: Spoke with Dr. Lilia Pro and he stated to start the patient on a Fentanyl PCA pump at the lowest settings.     Bedside and Verbal shift change report given to Steffanie Dunn, Therapist, sports (Soil scientist) by Nira Conn, RN (offgoing nurse). Report included the following information SBAR, Kardex, Procedure Summary, Intake/Output, MAR and Recent Results.

## 2017-08-16 LAB — METABOLIC PANEL, BASIC
Anion gap: 7 mmol/L (ref 5–15)
BUN/Creatinine ratio: 14 (ref 12–20)
BUN: 12 MG/DL (ref 6–20)
CO2: 26 mmol/L (ref 21–32)
Calcium: 7.7 MG/DL — ABNORMAL LOW (ref 8.5–10.1)
Chloride: 105 mmol/L (ref 97–108)
Creatinine: 0.85 MG/DL (ref 0.70–1.30)
GFR est AA: >60 mL/min/{1.73_m2} (ref >60)
GFR est non-AA: >60 mL/min/{1.73_m2} (ref >60)
Glucose: 134 mg/dL — ABNORMAL HIGH (ref 65–100)
Potassium: 3.8 mmol/L (ref 3.5–5.1)
Sodium: 138 mmol/L (ref 136–145)

## 2017-08-16 LAB — HEMOGLOBIN: HGB: 11.9 g/dL — ABNORMAL LOW (ref 12.1–17.0)

## 2017-08-16 MED ORDER — LORAZEPAM 2 MG/ML IJ SOLN
2 mg/mL | Freq: Four times a day (QID) | INTRAMUSCULAR | Status: DC | PRN
Start: 2017-08-16 — End: 2017-08-19
  Administered 2017-08-16 – 2017-08-19 (×5): via INTRAVENOUS

## 2017-08-16 MED ORDER — TRAMADOL 50 MG TAB
50 mg | Freq: Four times a day (QID) | ORAL | Status: DC | PRN
Start: 2017-08-16 — End: 2017-08-19
  Administered 2017-08-16: 19:00:00 via ORAL

## 2017-08-16 MED ORDER — FLU VACCINE QV 2018-19 (6 MOS+)(PF) 60 MCG (15 MCG X 4)/0.5 ML IM SYRINGE
60 mcg (15 mcg x 4)/0.5 mL | INTRAMUSCULAR | Status: DC
Start: 2017-08-16 — End: 2017-08-19

## 2017-08-16 MED ORDER — GABAPENTIN 100 MG CAP
100 mg | Freq: Two times a day (BID) | ORAL | Status: DC | PRN
Start: 2017-08-16 — End: 2017-08-19
  Administered 2017-08-16: 22:00:00 via ORAL

## 2017-08-16 MED ORDER — CELECOXIB 100 MG CAP
100 mg | Freq: Two times a day (BID) | ORAL | Status: DC | PRN
Start: 2017-08-16 — End: 2017-08-19
  Administered 2017-08-17: 20:00:00 via ORAL

## 2017-08-16 MED ORDER — HYDROCODONE-ACETAMINOPHEN 5 MG-325 MG TAB
5-325 mg | ORAL | Status: DC | PRN
Start: 2017-08-16 — End: 2017-08-19
  Administered 2017-08-17 – 2017-08-19 (×11): via ORAL

## 2017-08-16 MED ORDER — ACETAMINOPHEN 1,000 MG/100 ML (10 MG/ML) IV
1000 mg/100 mL (10 mg/mL) | Freq: Four times a day (QID) | INTRAVENOUS | Status: AC
Start: 2017-08-16 — End: 2017-08-17
  Administered 2017-08-16 – 2017-08-17 (×4): via INTRAVENOUS

## 2017-08-16 MED FILL — GABAPENTIN 100 MG CAP: 100 mg | ORAL | Qty: 2

## 2017-08-16 MED FILL — PANTOPRAZOLE 40 MG TAB, DELAYED RELEASE: 40 mg | ORAL | Qty: 1

## 2017-08-16 MED FILL — OFIRMEV 1,000 MG/100 ML (10 MG/ML) INTRAVENOUS SOLUTION: 1000 mg/100 mL (10 mg/mL) | INTRAVENOUS | Qty: 100

## 2017-08-16 MED FILL — TRAZODONE 50 MG TAB: 50 mg | ORAL | Qty: 1

## 2017-08-16 MED FILL — LISINOPRIL 10 MG TAB: 10 mg | ORAL | Qty: 1

## 2017-08-16 MED FILL — TRAMADOL 50 MG TAB: 50 mg | ORAL | Qty: 1

## 2017-08-16 MED FILL — NORMAL SALINE FLUSH 0.9 % INJECTION SYRINGE: INTRAMUSCULAR | Qty: 10

## 2017-08-16 MED FILL — LORAZEPAM 2 MG/ML IJ SOLN: 2 mg/mL | INTRAMUSCULAR | Qty: 1

## 2017-08-16 MED FILL — CLONAZEPAM 1 MG TAB: 1 mg | ORAL | Qty: 1

## 2017-08-16 NOTE — Progress Notes (Signed)
Patient with yellow clear foley output pain fairly controlled on scheduled medications and eager to walk today.    Bedside shift change report given to Menan (oncoming nurse) by Gabriel Cirri RN (offgoing nurse). Report included the following information SBAR, Kardex, Intake/Output, MAR and Recent Results.

## 2017-08-16 NOTE — Progress Notes (Signed)
vss af abd soft lab good.  Pain an issue but intolerant to narcotics.  Will add other non narcotic iptions.

## 2017-08-16 NOTE — Progress Notes (Signed)
Reason for Admission:   Lt renal mass, s/p Lt intrarenal partial nephrectomy                   RRAT Score:  O/ low                   Plan for utilizing home health:  Pt does not anticipate that he will need HH.                         Likelihood of Readmission:  low                          Transition of Care Plan:  Pt will return home and f/u with Urology.    Care Management Interventions  PCP Verified by CM: Yes(Bennett, Louretta Shorten, MD, last office visit was 3 months ago)  Palliative Care Criteria Met (RRAT>21 & CHF Dx)?: No  Mode of Transport at Discharge: Other (see comment)(pt's daughter)  Current Support Network: Lives Alone  Confirm Follow Up Transport: Self  Plan discussed with Pt/Family/Caregiver: Yes  Freedom of Choice Offered: Yes     Joanna Puff, RN ACM CRM

## 2017-08-16 NOTE — Progress Notes (Addendum)
1800: Spoke with Dr. Lilia Pro about the patient still having a lot of pain unrelieved by any ordered pain medications. Dr. Lilia Pro instructed me to give Ativan 1mg  Q6 PRN.     Bedside and Verbal shift change report given to [place], Therapist, sports (Soil scientist) by Nira Conn, RN (offgoing nurse). Report included the following information SBAR, Kardex, Procedure Summary, Intake/Output, MAR and Recent Results.

## 2017-08-17 LAB — METABOLIC PANEL, BASIC
Anion gap: 6 mmol/L (ref 5–15)
BUN/Creatinine ratio: 9 — ABNORMAL LOW (ref 12–20)
BUN: 7 MG/DL (ref 6–20)
CO2: 27 mmol/L (ref 21–32)
Calcium: 8.3 MG/DL — ABNORMAL LOW (ref 8.5–10.1)
Chloride: 105 mmol/L (ref 97–108)
Creatinine: 0.79 MG/DL (ref 0.70–1.30)
GFR est AA: >60 mL/min/{1.73_m2} (ref >60)
GFR est non-AA: >60 mL/min/{1.73_m2} (ref >60)
Glucose: 136 mg/dL — ABNORMAL HIGH (ref 65–100)
Potassium: 3.4 mmol/L — ABNORMAL LOW (ref 3.5–5.1)
Sodium: 138 mmol/L (ref 136–145)

## 2017-08-17 LAB — HEMOGLOBIN: HGB: 11.4 g/dL — ABNORMAL LOW (ref 12.1–17.0)

## 2017-08-17 LAB — CREATININE, FLUID: Creatinine, fluid: 0.82 MG/DL

## 2017-08-17 MED ORDER — HYDROCODONE-ACETAMINOPHEN 5 MG-325 MG TAB
5-325 mg | ORAL_TABLET | Freq: Four times a day (QID) | ORAL | 0 refills | Status: AC | PRN
Start: 2017-08-17 — End: ?

## 2017-08-17 MED ORDER — CEPHALEXIN 500 MG CAP
500 mg | ORAL_CAPSULE | Freq: Four times a day (QID) | ORAL | 0 refills | Status: AC
Start: 2017-08-17 — End: 2017-08-22

## 2017-08-17 MED ORDER — POTASSIUM CHLORIDE 10 MEQ/100 ML IV PIGGY BACK
10 mEq/0 mL | INTRAVENOUS | Status: AC
Start: 2017-08-17 — End: 2017-08-17
  Administered 2017-08-17 (×2): via INTRAVENOUS

## 2017-08-17 MED FILL — LISINOPRIL 10 MG TAB: 10 mg | ORAL | Qty: 1

## 2017-08-17 MED FILL — HYDROCODONE-ACETAMINOPHEN 5 MG-325 MG TAB: 5-325 mg | ORAL | Qty: 1

## 2017-08-17 MED FILL — NORMAL SALINE FLUSH 0.9 % INJECTION SYRINGE: INTRAMUSCULAR | Qty: 10

## 2017-08-17 MED FILL — LORAZEPAM 2 MG/ML IJ SOLN: 2 mg/mL | INTRAMUSCULAR | Qty: 1

## 2017-08-17 MED FILL — POTASSIUM CHLORIDE 10 MEQ/100 ML IV PIGGY BACK: 10 mEq/0 mL | INTRAVENOUS | Qty: 100

## 2017-08-17 MED FILL — CLONAZEPAM 1 MG TAB: 1 mg | ORAL | Qty: 1

## 2017-08-17 MED FILL — CELECOXIB 100 MG CAP: 100 mg | ORAL | Qty: 1

## 2017-08-17 MED FILL — TRAZODONE 50 MG TAB: 50 mg | ORAL | Qty: 1

## 2017-08-17 MED FILL — PANTOPRAZOLE 40 MG TAB, DELAYED RELEASE: 40 mg | ORAL | Qty: 1

## 2017-08-17 MED FILL — OFIRMEV 1,000 MG/100 ML (10 MG/ML) INTRAVENOUS SOLUTION: 1000 mg/100 mL (10 mg/mL) | INTRAVENOUS | Qty: 100

## 2017-08-17 NOTE — Progress Notes (Signed)
vss af abd soft.  No flatus. Ativan works best for his pain control. Try to avoid narcotics. jp creat. Dc when bowel open.

## 2017-08-17 NOTE — Progress Notes (Signed)
Patient walked 1x lap in the halls this am, foley removed. Stated pain was controlled on norco overnight.    Bedside shift change report given to Verdis Frederickson RN (oncoming nurse) by Gabriel Cirri RN (offgoing nurse). Report included the following information SBAR, Kardex, Procedure Summary, Intake/Output, MAR and Recent Results.

## 2017-08-18 LAB — METABOLIC PANEL, BASIC
Anion gap: 8 mmol/L (ref 5–15)
BUN/Creatinine ratio: 8 — ABNORMAL LOW (ref 12–20)
BUN: 6 MG/DL (ref 6–20)
CO2: 27 mmol/L (ref 21–32)
Calcium: 8.3 MG/DL — ABNORMAL LOW (ref 8.5–10.1)
Chloride: 104 mmol/L (ref 97–108)
Creatinine: 0.76 MG/DL (ref 0.70–1.30)
GFR est AA: >60 mL/min/{1.73_m2} (ref >60)
GFR est non-AA: >60 mL/min/{1.73_m2} (ref >60)
Glucose: 124 mg/dL — ABNORMAL HIGH (ref 65–100)
Potassium: 3.2 mmol/L — ABNORMAL LOW (ref 3.5–5.1)
Sodium: 139 mmol/L (ref 136–145)

## 2017-08-18 LAB — HEMOGLOBIN: HGB: 10.6 g/dL — ABNORMAL LOW (ref 12.1–17.0)

## 2017-08-18 MED ORDER — DEXTROSE 5%-1/2 NORMAL SALINE IV
2 mEq/mL | INTRAVENOUS | Status: DC
Start: 2017-08-18 — End: 2017-08-18
  Administered 2017-08-18: 23:00:00 via INTRAVENOUS

## 2017-08-18 MED ORDER — POTASSIUM CHLORIDE SR 10 MEQ TAB
10 mEq | Freq: Two times a day (BID) | ORAL | Status: AC
Start: 2017-08-18 — End: 2017-08-19
  Administered 2017-08-18 – 2017-08-19 (×2): via ORAL

## 2017-08-18 MED ORDER — D5-1/2 NS & POTASSIUM CHLORIDE 20 MEQ/L IV
20 mEq/L | INTRAVENOUS | Status: DC
Start: 2017-08-18 — End: 2017-08-19

## 2017-08-18 MED FILL — K-TAB 10 MEQ TABLET,EXTENDED RELEASE: 10 mEq | ORAL | Qty: 2

## 2017-08-18 MED FILL — CLONAZEPAM 1 MG TAB: 1 mg | ORAL | Qty: 1

## 2017-08-18 MED FILL — DEXTROSE 5%-1/2 NORMAL SALINE IV: INTRAVENOUS | Qty: 1000

## 2017-08-18 MED FILL — HYDROCODONE-ACETAMINOPHEN 5 MG-325 MG TAB: 5-325 mg | ORAL | Qty: 1

## 2017-08-18 MED FILL — TRAZODONE 50 MG TAB: 50 mg | ORAL | Qty: 1

## 2017-08-18 MED FILL — NORMAL SALINE FLUSH 0.9 % INJECTION SYRINGE: INTRAMUSCULAR | Qty: 10

## 2017-08-18 MED FILL — LORAZEPAM 2 MG/ML IJ SOLN: 2 mg/mL | INTRAMUSCULAR | Qty: 1

## 2017-08-18 MED FILL — PANTOPRAZOLE 40 MG TAB, DELAYED RELEASE: 40 mg | ORAL | Qty: 1

## 2017-08-18 NOTE — Progress Notes (Signed)
Bedside shift change report given to Maria, RN (oncoming nurse) by Dana, RN (offgoing nurse). Report included the following information SBAR, Kardex, Intake/Output and MAR.

## 2017-08-18 NOTE — Progress Notes (Signed)
Urology Progress Note    Patient: John [name] MRN: [id number]  SSN: [id number]    Date of Birth: Jul 17, 1956  Age: 61 y.o.  Sex: male        ADMITTED: 08/15/2017 to Cecilio Asper, MD for LEFT RENAL MASS  Renal mass  POD# 3 Days Post-Op Procedure(s):  LAPAROSCOPIC HAND ASSISTED LEFT PARTIAL NEPHRECTOMY WITH INTRA OPERATIVE ULTRASOUND    Pain controlled. Passing flatus. Tolerating liquids. Hungry.  Voiding.    Vitals: Temp (24hrs), Avg:98.1 ??F (36.7 ??C), Min:97.7 ??F (36.5 ??C), Max:98.5 ??F (36.9 ??C)                   Blood pressure 100/65, pulse 60, temperature 97.8 ??F (36.6 ??C), resp. rate 16, height 6\' 3"  (1.905 m), weight 92.7 kg (204 lb 5.9 oz), SpO2 97 %.    Intake and Output:  12/13 1901 - 12/15 0700  In: -   Out: 0347 [Urine:5600; Drains:370]      Abd- soft benign.     Hgb 10.6  K 3.2  Creat 0.76  JP = serum      Labs:  Labs:   Lab Results   Component Value Date/Time    WBC 5.0 08/01/2017 10:09 AM    HGB 10.6 (L) 08/18/2017 05:42 AM    Creatinine 0.76 08/18/2017 05:42 AM         Assessment/Plan:   Advance diet.    Replace K.   Probably home tomorrow if tolerates diet.       Signed By: Thad Ranger, MD - August 18, 2017

## 2017-08-19 LAB — METABOLIC PANEL, BASIC
Anion gap: 8 mmol/L (ref 5–15)
BUN/Creatinine ratio: 10 — ABNORMAL LOW (ref 12–20)
BUN: 8 MG/DL (ref 6–20)
CO2: 27 mmol/L (ref 21–32)
Calcium: 8.3 MG/DL — ABNORMAL LOW (ref 8.5–10.1)
Chloride: 103 mmol/L (ref 97–108)
Creatinine: 0.81 MG/DL (ref 0.70–1.30)
GFR est AA: >60 mL/min/{1.73_m2} (ref >60)
GFR est non-AA: >60 mL/min/{1.73_m2} (ref >60)
Glucose: 107 mg/dL — ABNORMAL HIGH (ref 65–100)
Potassium: 3.7 mmol/L (ref 3.5–5.1)
Sodium: 138 mmol/L (ref 136–145)

## 2017-08-19 LAB — HGB & HCT
HCT: 32.4 % — ABNORMAL LOW (ref 36.6–50.3)
HGB: 11 g/dL — ABNORMAL LOW (ref 12.1–17.0)

## 2017-08-19 MED FILL — HYDROCODONE-ACETAMINOPHEN 5 MG-325 MG TAB: 5-325 mg | ORAL | Qty: 1

## 2017-08-19 MED FILL — TRAZODONE 50 MG TAB: 50 mg | ORAL | Qty: 1

## 2017-08-19 MED FILL — NORMAL SALINE FLUSH 0.9 % INJECTION SYRINGE: INTRAMUSCULAR | Qty: 10

## 2017-08-19 MED FILL — LORAZEPAM 2 MG/ML IJ SOLN: 2 mg/mL | INTRAMUSCULAR | Qty: 1

## 2017-08-19 MED FILL — LISINOPRIL 10 MG TAB: 10 mg | ORAL | Qty: 1

## 2017-08-19 MED FILL — CLONAZEPAM 1 MG TAB: 1 mg | ORAL | Qty: 1

## 2017-08-19 MED FILL — K-TAB 10 MEQ TABLET,EXTENDED RELEASE: 10 mEq | ORAL | Qty: 2

## 2017-08-19 MED FILL — PANTOPRAZOLE 40 MG TAB, DELAYED RELEASE: 40 mg | ORAL | Qty: 1

## 2017-08-19 NOTE — Discharge Summary (Signed)
[facility] SUMMARY    Name:[name], John  MR#: [id number]  DOB: 1955/12/26  ACCOUNT #: [id number]   ADMIT DATE: 08/15/2017  DISCHARGE DATE: 08/19/2017    ADMITTING DIAGNOSIS:  Left renal mass.    DISCHARGE DIAGNOSIS:   Left renal mass.    PROCEDURE:  Left partial nephrectomy.    HOSPITAL COURSE:  The patient underwent the above operation on the day of admission.  Postoperatively, his hemoglobin remained stable.  He had an output from his drain, but no evidence of a urine leak.  His bowel function returned and he was considered to be ready for discharge on the above date.  His pain was controlled throughout the hospitalization.  His condition at discharge was good.  He was discharged home with oral antibiotics and pain medication and his condition was stable.    DISPOSITION:  Home.      Pathology pending.      Cecilio Asper, MD       [facility]  D: 08/28/2017 09:10     T: 08/28/2017 09:14  JOB #: [id number]  CC: Algis Greenhouse MD

## 2017-08-19 NOTE — Progress Notes (Signed)
Looks great. Incisional discomfort improved.  Tolerating regular diet. Passing gas.  VSS.  Labs OK    Remove drain.  D/C home.

## 2017-08-19 NOTE — Progress Notes (Signed)
1040 Patient drain removed per order, dry dressings placed, dressings changes per order. Patient educated with discharge instructions and Rx. Patient verbalized undertsanding with adequate teach back. Patient signed copy of the discharge instructions that were then placed on the hard chart.

## 2017-08-19 NOTE — Progress Notes (Signed)
Bedside shift change report given to Maria, RN (oncoming nurse) by Dana, RN (offgoing nurse). Report included the following information SBAR, Kardex, Intake/Output and MAR.

## 2017-08-19 NOTE — Discharge Summary (Signed)
[facility] SUMMARY    Name:John [name], John [name]  MR#: [id number]  DOB: [birth year]  ACCOUNT #: [id number]   ADMIT DATE: 08/15/2017  DISCHARGE DATE: 08/19/2017    ADMITTING DIAGNOSIS:  Left renal mass.    DISCHARGE DIAGNOSIS:   Left renal mass.    PROCEDURE:  Left partial nephrectomy.    HOSPITAL COURSE:  The patient underwent the above operation on the day of admission.  Postoperatively, his hemoglobin remained stable.  He had an output from his drain, but no evidence of a urine leak.  His bowel function returned and he was considered to be ready for discharge on the above date.  His pain was controlled throughout the hospitalization.  His condition at discharge was good.  He was discharged home with oral antibiotics and pain medication and his condition was stable.    DISPOSITION:  Home.      Pathology pending.      Cecilio Asper, MD       [facility]  D: 08/28/2017 09:10     T: 08/28/2017 09:14  JOB #: [id number]  CC: Algis Greenhouse MD

## 2020-01-07 NOTE — Progress Notes (Signed)
Formatting of this note is different from the original.  Subjective:    Patient ID: John [name] is a 64 y.o. male.    HPI   Skin complaint   Patient presents with: skin infection    Mechanism of injury: fall    Incident location:  Other  Laterality:  On the right  Location/Radiation:  Lower leg  Pain severity:  Mild  Aggravated by:  OTC medications  Tetanus status:  Unknown      Review of Systems   Skin: Positive for wound.   All other systems reviewed and are negative.    Social History     Tobacco Use   Smoking Status Former Smoker   Smokeless Tobacco Never Used     Past Medical History:   Diagnosis Date   ? Hypertension      No past surgical history on file.  No family history on file.  Objective:   Physical Exam  Vitals reviewed.   Constitutional:       Appearance: He is well-developed.   HENT:      Head: Normocephalic.   Cardiovascular:      Rate and Rhythm: Normal rate and regular rhythm.   Pulmonary:      Effort: Pulmonary effort is normal.      Breath sounds: Normal breath sounds.   Skin:     General: Skin is warm and dry.      Comments: Right lower leg wound, small amount yellow dc, no warmth or swelling, mild erythema surrounding, no sx cellulitis, firmness, tenderness noted. Pedal pulse intact, no distal swelling.      Neurological:      Mental Status: He is alert and oriented to person, place, and time.   Psychiatric:         Behavior: Behavior normal.         Thought Content: Thought content normal.         Judgment: Judgment normal.         Assessment/Plan:                                        Counseling provided. Lifestyle modifications encouraged/reinforced. Treatment plan reviewed with patient in accordance with [facility] guidelines. Pt declines OSA.  Will return in 2-3 days if not better.   Subjective:    Patient ID: John [name] is a 64 y.o. male here for a Skin complaint  visit.    Lifestyle: Acelin reports that he has quit smoking. He has never used smokeless tobacco.    Objective:    Assessment/Plan:    Vaccine administered in accordance with [facility] guidelines.    Patient advised to contact [facility] if adverse event occurs.   Electronically signed by Manuella Ghazi, NP at 01/07/2020 11:04 AM EDT

## 2020-01-07 NOTE — Addendum Note (Signed)
Addended by: Manuella Ghazi on: 01/07/2020 11:05 AM     Modules accepted: Orders      Electronically signed by Manuella Ghazi, NP at 01/07/2020 11:05 AM EDT

## 2020-01-07 NOTE — Addendum Note (Signed)
Addended by: Manuella Ghazi on: 01/07/2020 11:02 AM     Modules accepted: SmartSet      Electronically signed by Manuella Ghazi, NP at 01/07/2020 11:02 AM EDT

## 2021-03-18 IMAGING — DX CHEST PA AND LATERAL
1 series · 3 of 3 positions shown · non-contrast
Comparison: None

FINAL Diagnostic Imaging Report 
________________________________________________________________________________________________ 
CLINICAL INDICATION: History of renal cell carcinoma, previous smoker

[Series 1: PA · 0.14mm/px · 3 of 3 slices shown]
[im 1/3]
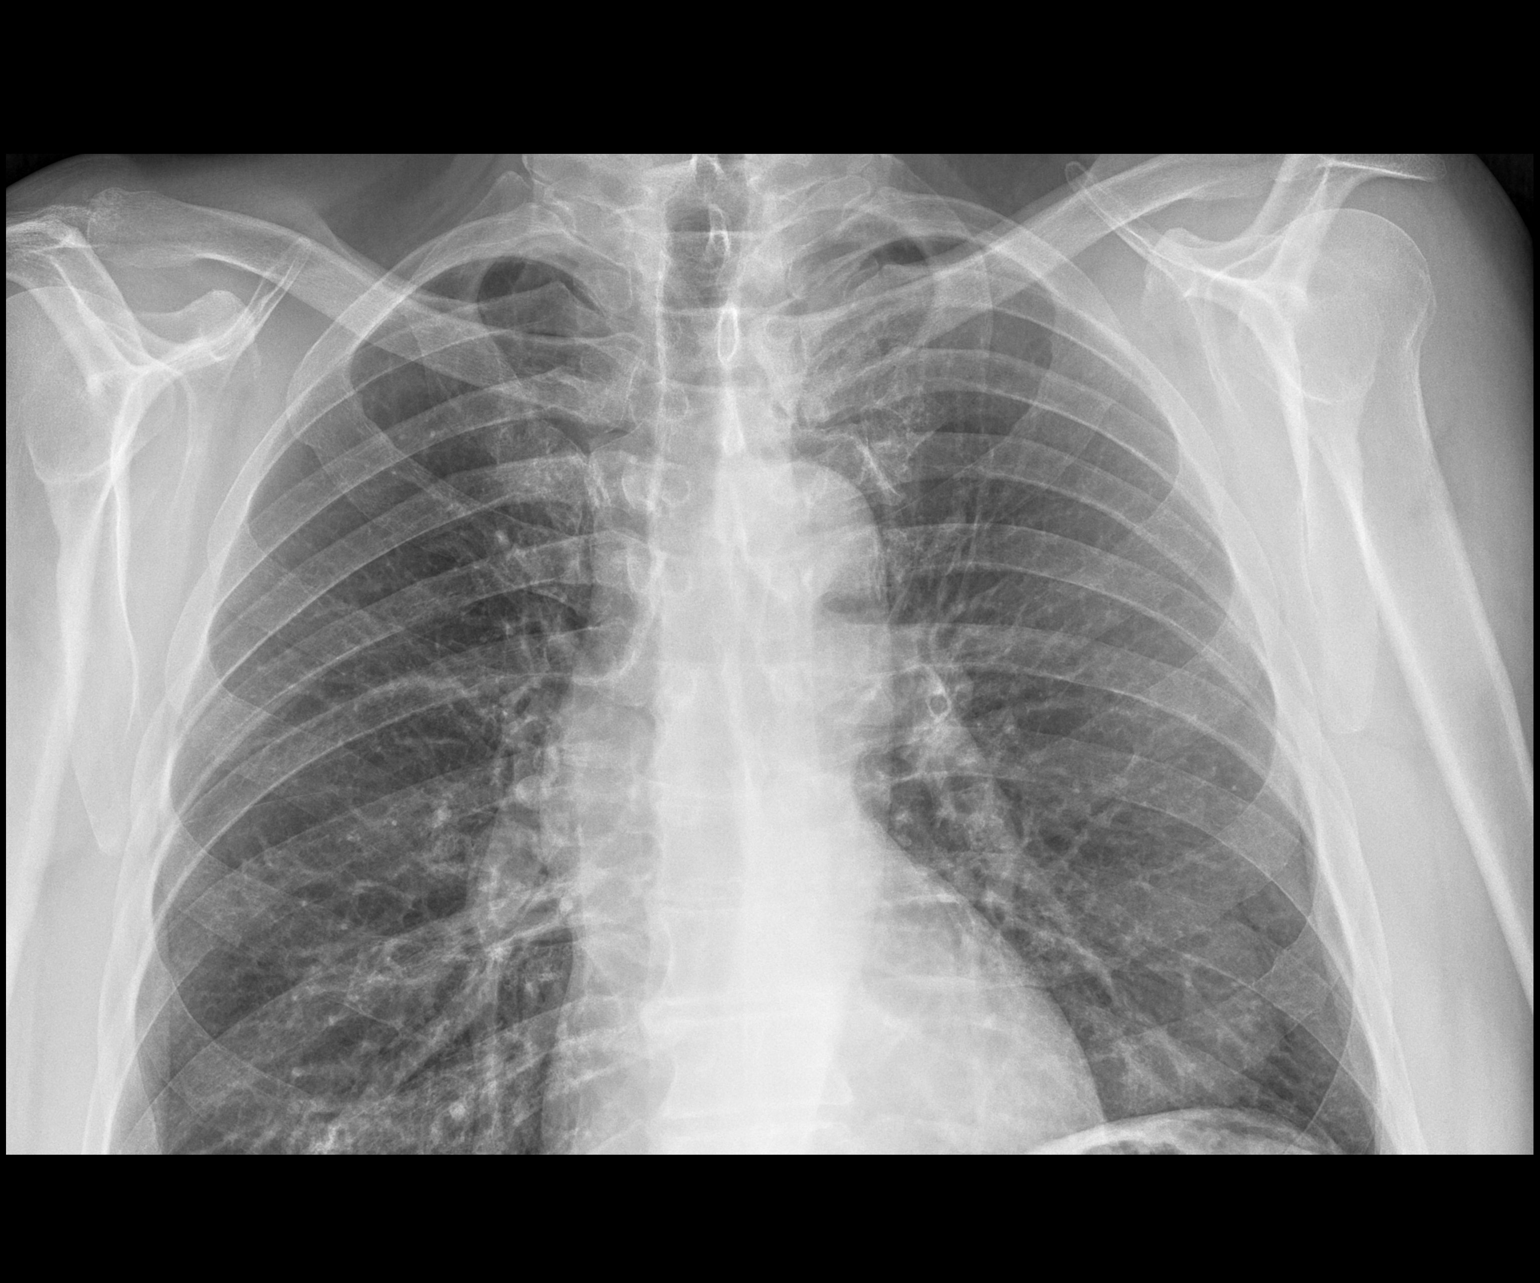
[im 2/3]
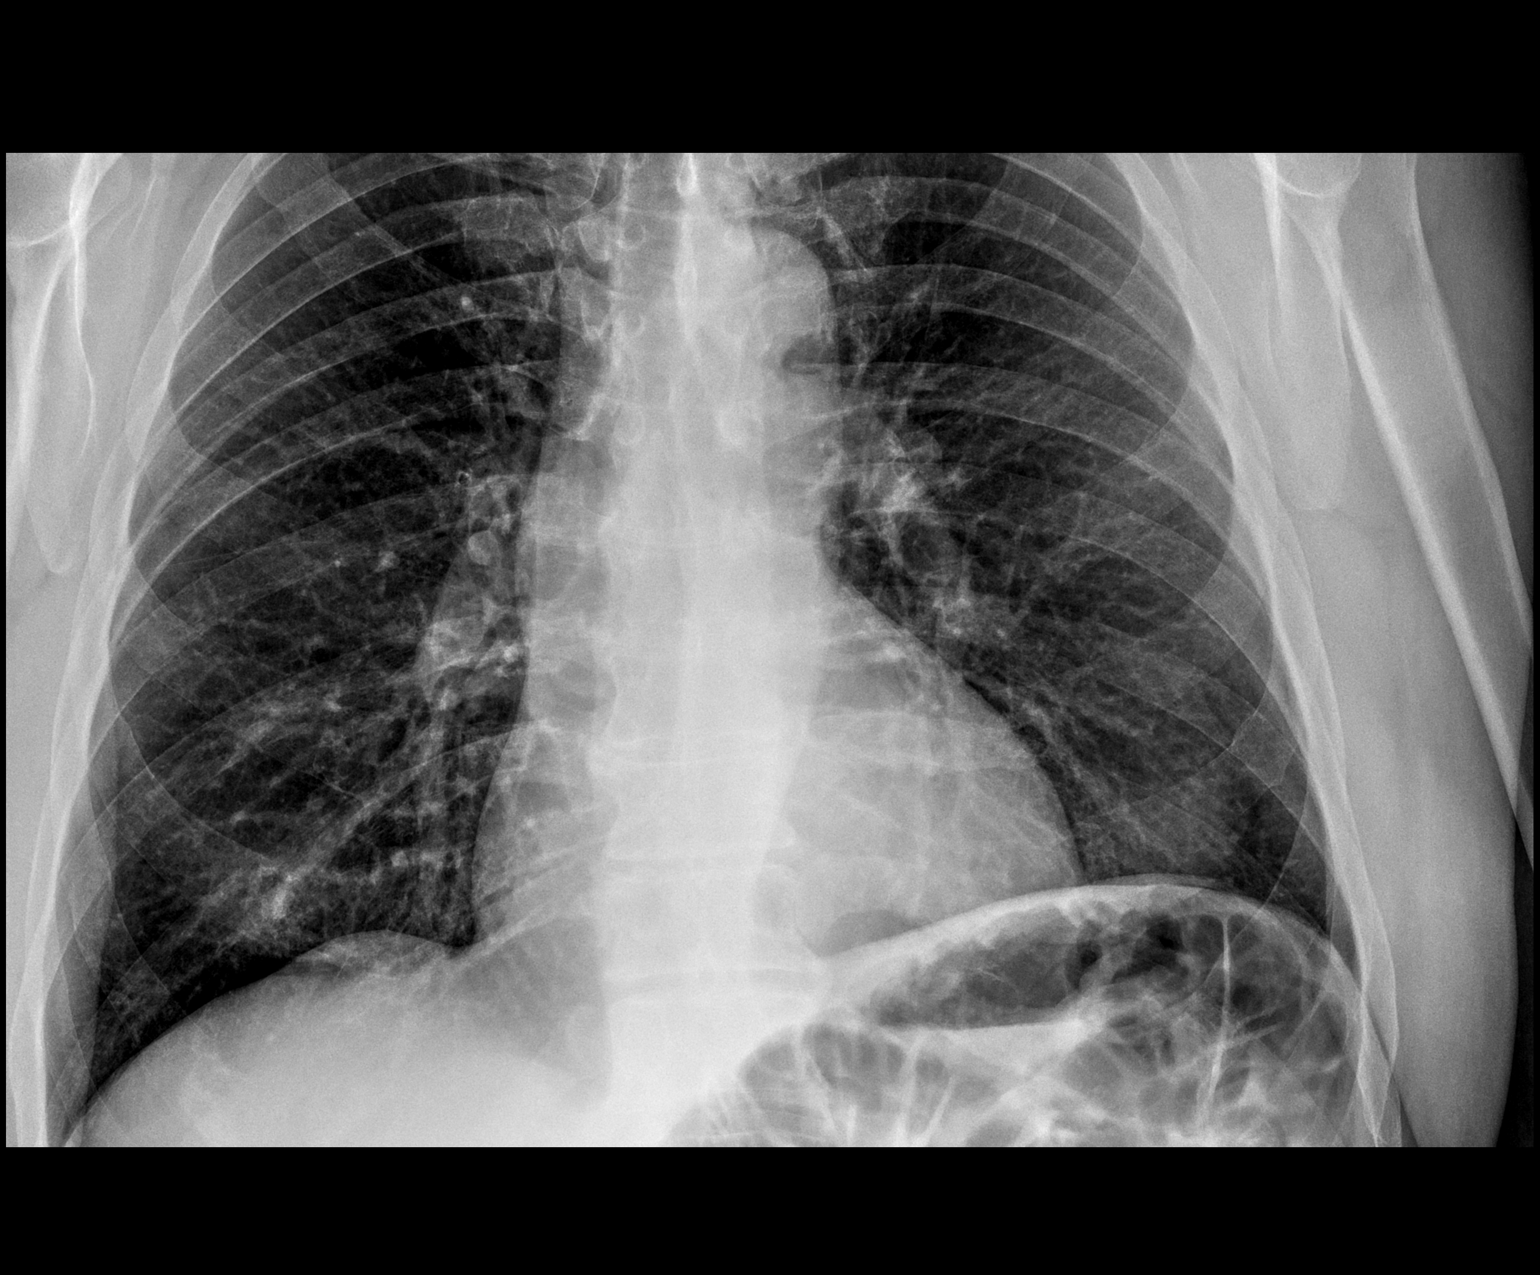
[im 3/3]
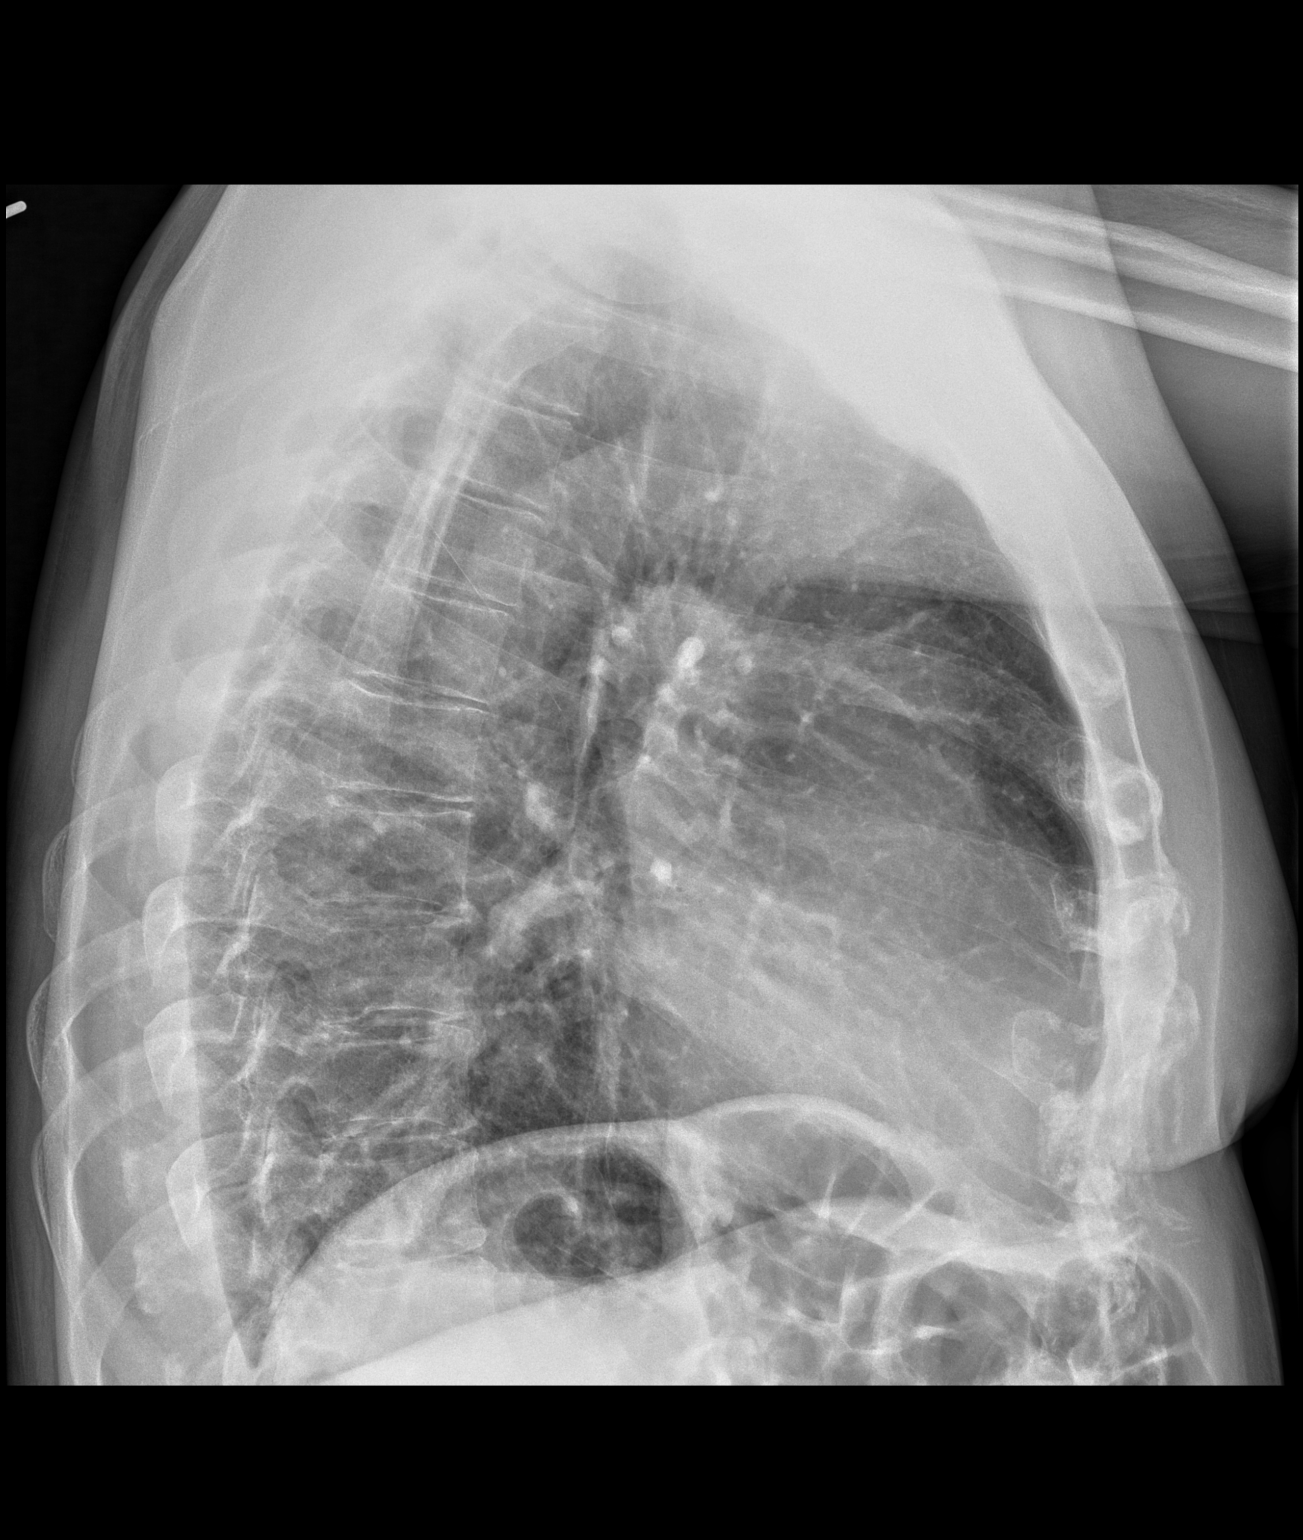

[3 of 3 positions shown; findings below may reference images not displayed]

FINDINGS: Heart size within normal limits. Tortuous ascending aorta. Mild 
coarsening of the bronchovascular interstitium. No focal infiltrate or pleural 
effusion. Increased retrosternal clear space. Mild degenerative thoracic 
spondylosis. Normal hilar contours.
IMPRESSION: Mild bronchitis. Otherwise nonacute.

## 2021-03-25 IMAGING — MR MRI LEFT SHOULDER WITHOUT CONTRAST
4 of 7 series · 18 of 40 positions shown · IV contrast (gadolinium)
Comparison: None

FINAL Diagnostic Imaging Report 
________________________________________________________________________________________________ 
MRI LEFT SHOULDER WITHOUT CONTRAST, 03/25/2021 [DATE]: 
CLINICAL INDICATION: Pain in unspecified shoulder.
TECHNIQUE: Multiplanar, multiecho position MR images of the shoulder were 
performed without intravenous gadolinium enhancement. Patient was scanned on a 
1.5T magnet.

[Series 6: T2 fat-sat · oblique · 4.0mm · 0.31mm/px · 5 of 24 slices shown (1 of 2)]
[im 1/24]
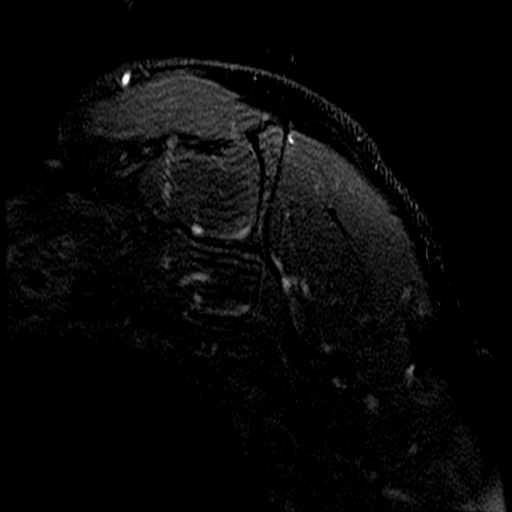
[im 5/24]
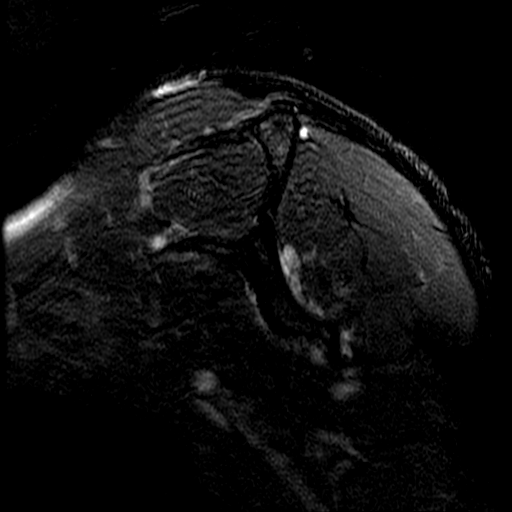
[im 10/24]
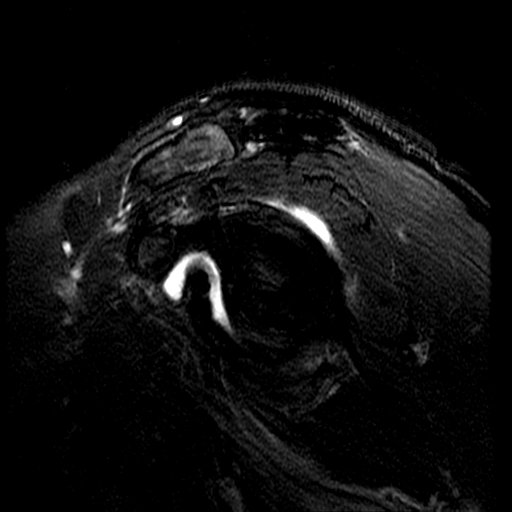
[im 14/24]
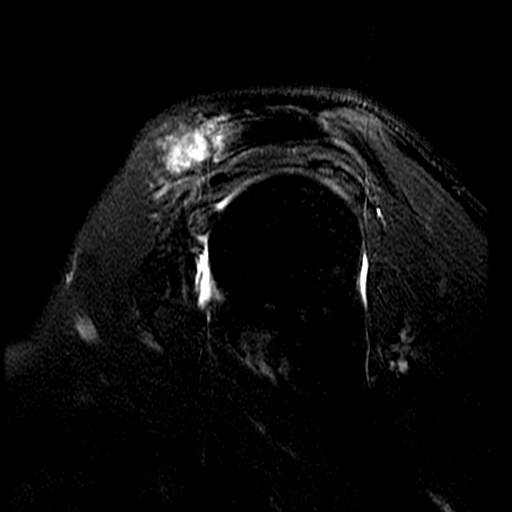
[im 24/24]
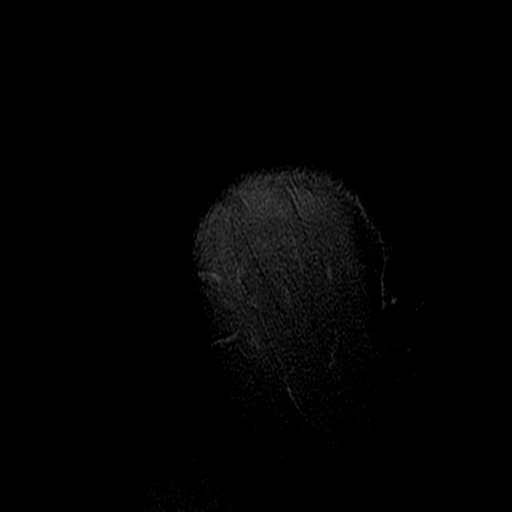

[Series 7: T1 · oblique · 4.0mm · 0.31mm/px · 3 of 24 slices shown]
[im 5/24]
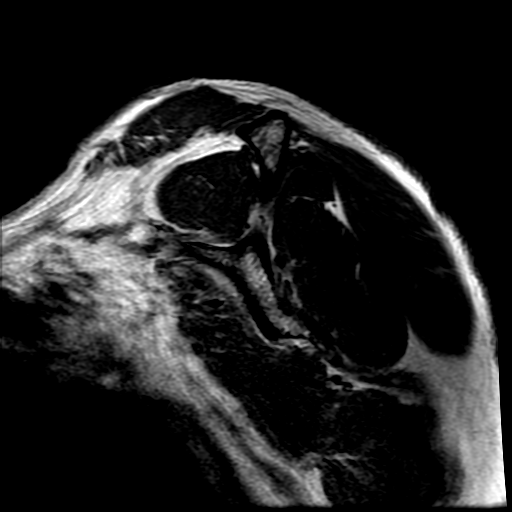
[im 14/24]
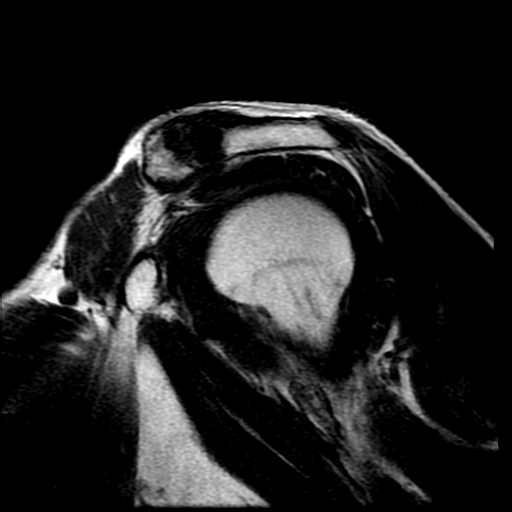
[im 24/24]
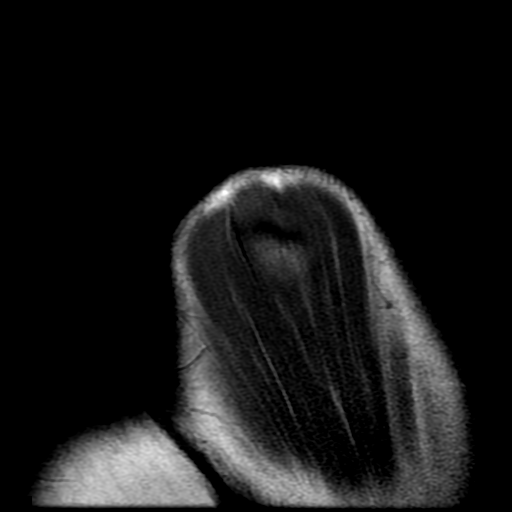

[Series 8: T2 fat-sat · oblique · 3.0mm · 0.31mm/px · 3 of 23 slices shown (2 of 2)]
[im 5/23]
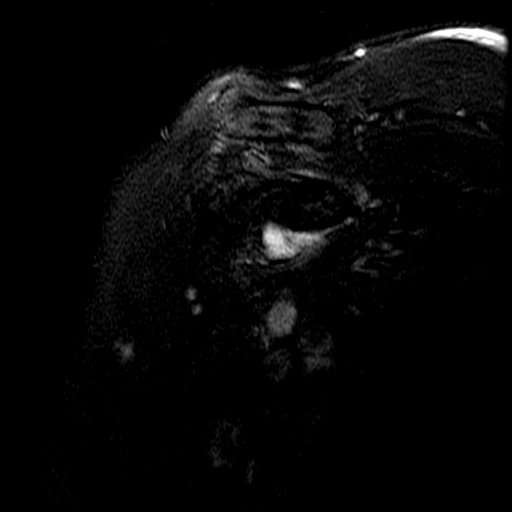
[im 14/23]
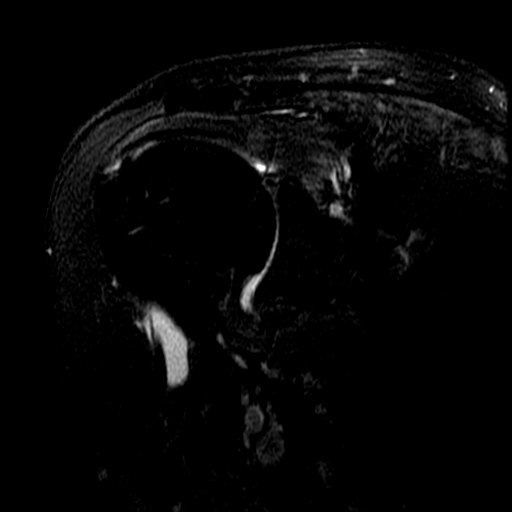
[im 23/23]
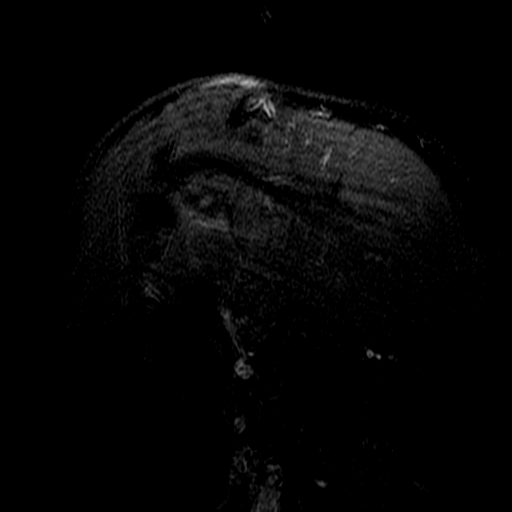

[Series 9: PD fat-sat · axial · 3.0mm · 0.31mm/px · z∈[-39,+55]mm · 7 of 29 slices shown]
[im 1/29]
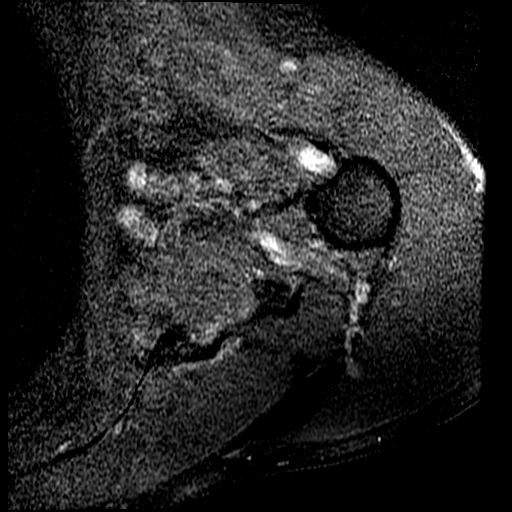
[im 5/29]
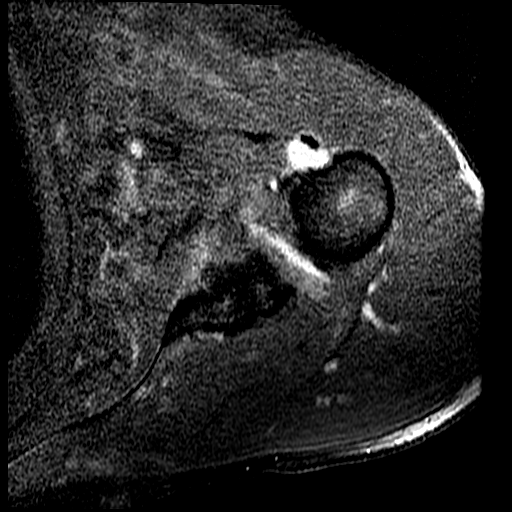
[im 10/29]
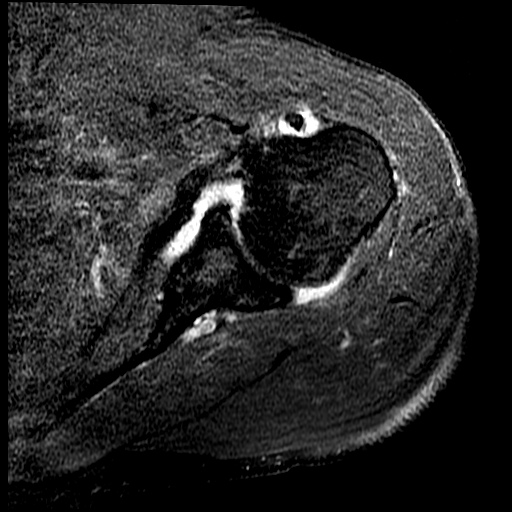
[im 15/29]
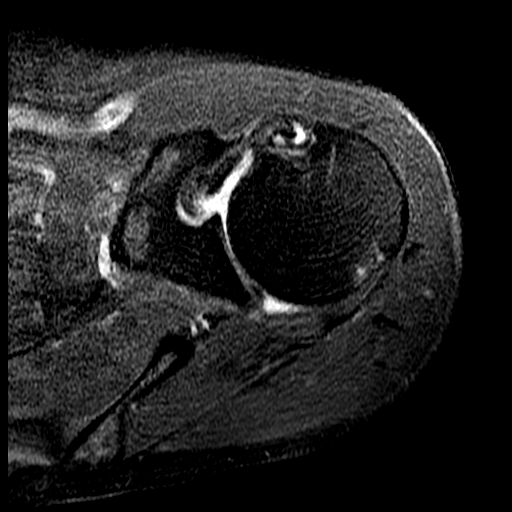
[im 19/29]
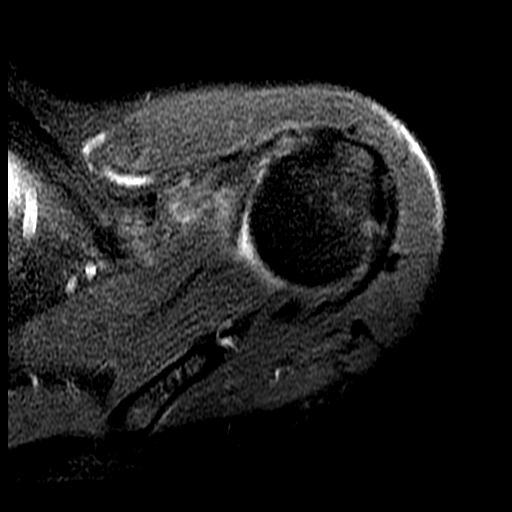
[im 24/29]
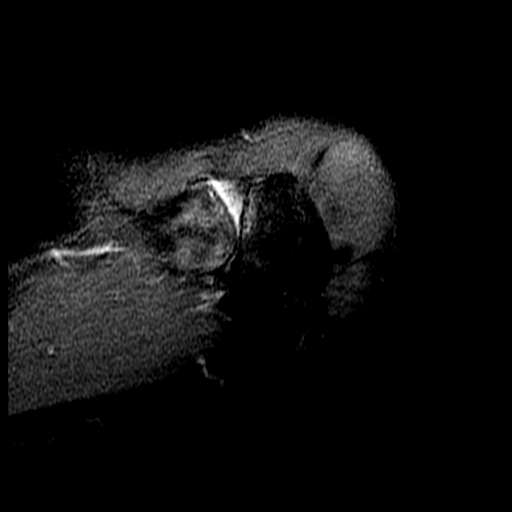
[im 29/29]
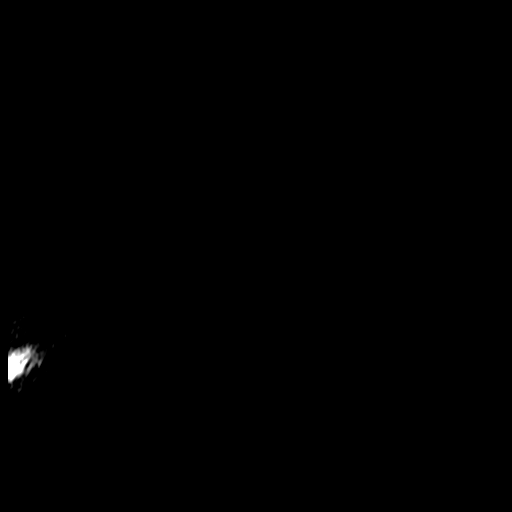

[18 of 40 positions shown; findings below may reference images not displayed]

FINDINGS: ROTATOR CUFF: 0.5 cm intermediate grade, concealed, intrasubstance distal 
supraspinatus tendon tear. Distal subscapularis tendinosis. The infraspinatus 
and teres minor tendons are intact. The rotator cuff musculature is symmetric 
without mass, signal abnormality or atrophy. 
ACROMIOCLAVICULAR JOINT: Mild degenerative change of the acromioclavicular joint 
with mild mass effect upon the supraspinatus. The coracoacromial ligament is 
intact without prominent spurring at the acromial attachment. The 
acromioclavicular and coracoclavicular ligaments are preserved. The acromium is 
normal in morphology. 
GLENOHUMERAL JOINT: The humeral head is well located within the glenoid fossa. 
Articular cartilage is preserved.  The glenoid labrum is preserved. No 
paralabral cyst. Mild tendinosis of the long head of the biceps tendon. Small 
shoulder joint effusion. 
BONES: No fracture. 5 mm subcortical cyst and adjacent marrow edema in the 
lesser tuberosity of the humerus. No fracture or Hill-Sachs defect. Subcortical 
cystic change of the humeral head. 
ADDITIONAL FINDINGS: The axillary region is negative. Subcutaneous tissues are 
negative.
IMPRESSION: 1.  0.5 cm intermediate grade, concealed, intrasubstance distal supraspinatus 
tendon tear. 
2.  Distal subscapularis tendinosis.  
3.  Biceps tendinosis and subcortical cystic change/marrow edema in the humeral 
lesser tuberosity, likely reactive.  
4.  Mild AC joint degenerative change with mild mass effect upon the 
supraspinatus.  
5.  Small joint effusion.

## 2021-10-03 IMAGING — CT CT ABDOMEN W/WO CONTRAST
3 of 10 series · 12 of 46 positions shown, 18 images · IV contrast (APPLIED)
Comparison: None

________________________________________________________________________________________________ 
CT ABDOMEN W/WO CONTRAST, 10/03/2021 [DATE]: 
CLINICAL INDICATION: History of partial left renal resection for renal cell 
carcinoma. Follow-up. 
A search for DICOM formatted images was conducted for prior CT imaging studies 
completed at a non-affiliated media free facility.
TECHNIQUE: The abdomen was scanned from lung bases through the aortic 
bifurcation without and with 100 mL of Isovue 300 injected intravenously on a 
high-resolution CT scanner using dose reduction techniques. Routine MPR 
reconstructions were performed. The patients eGFR was calculated to be
mL/min/1.73 m2 using the i-STAT device.

[Series 5: arterial 3.0 i41s 2 · axial · arterial · 0.82mm/px · z∈[+744,+813]mm · 3 of 94 slices shown]
[im 12/94  soft-tissue]
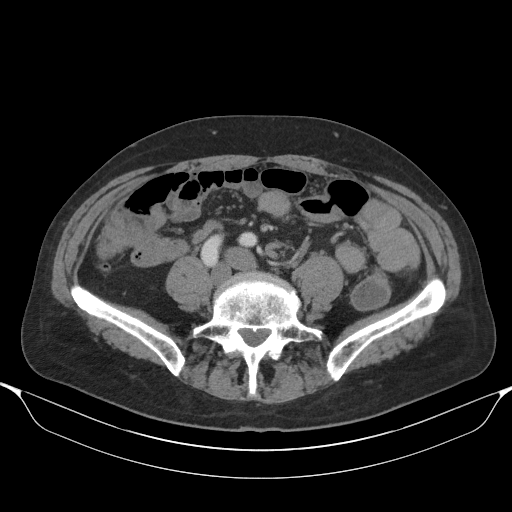
[im 24/94  soft-tissue]
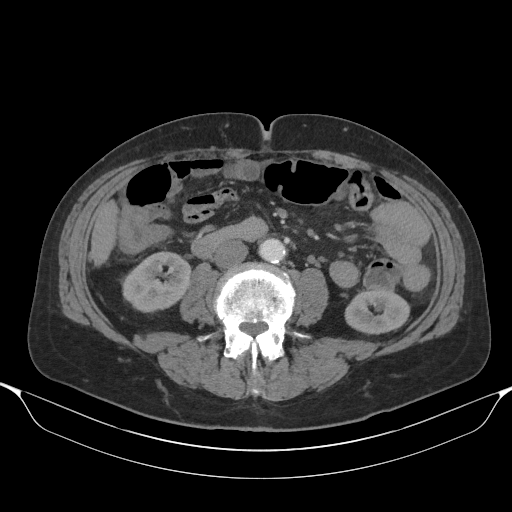
[im 35/94  soft-tissue]
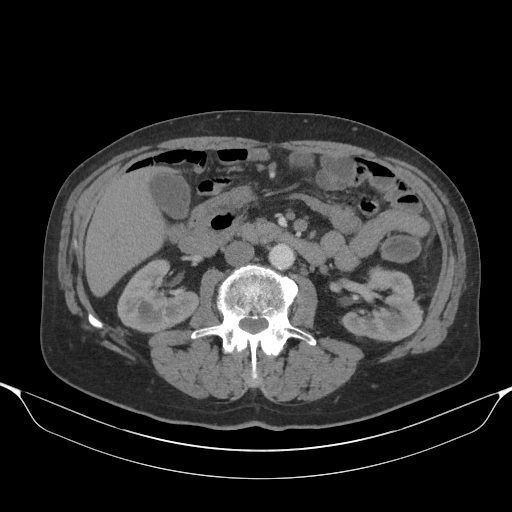

[Series 8: portal 3.0 i41s 2 · axial · portal-venous · 0.82mm/px · z∈[+744,+954]mm · 7 of 94 slices shown, 12 images]
[im 12/94  soft-tissue]
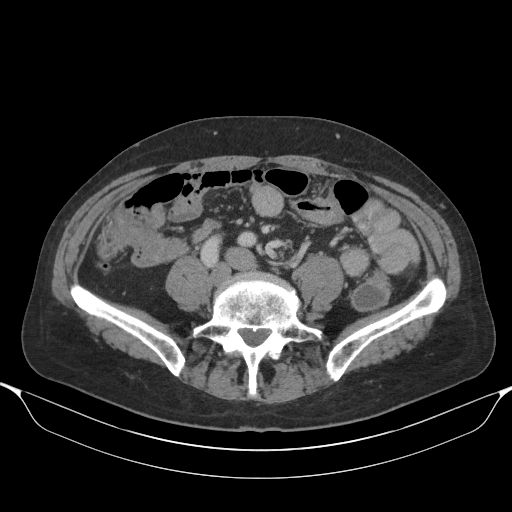
[im 12/94  bone]
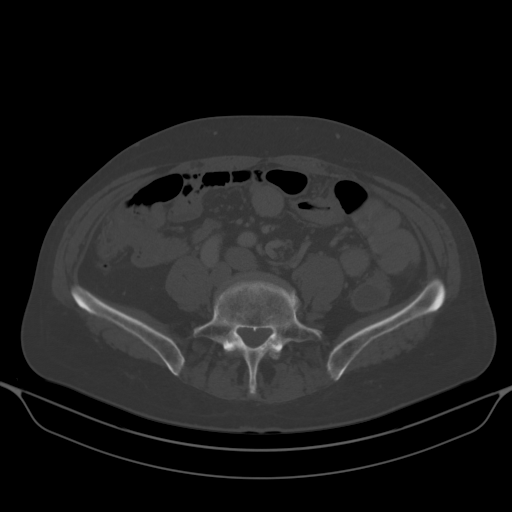
[im 24/94  soft-tissue]
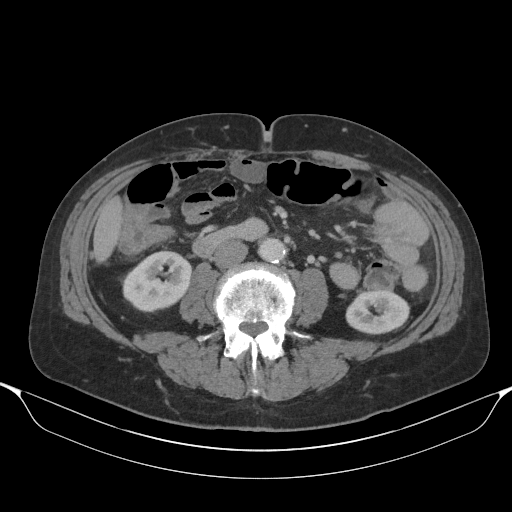
[im 35/94  soft-tissue]
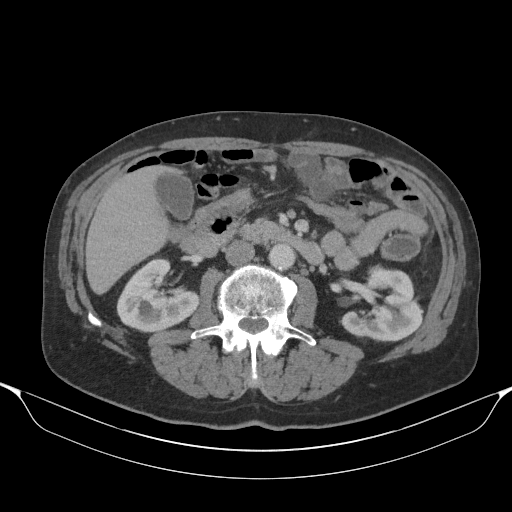
[im 47/94  soft-tissue]
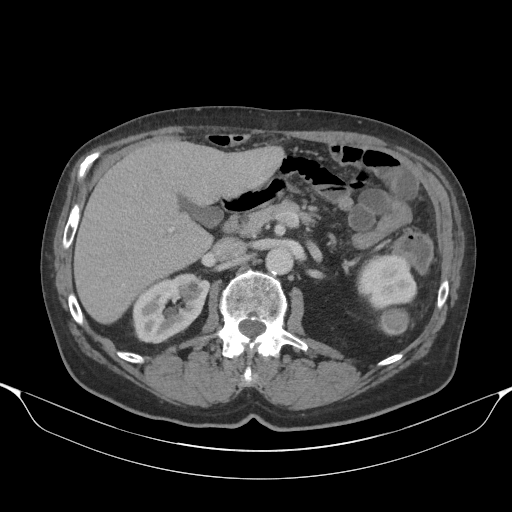
[im 47/94  lung]
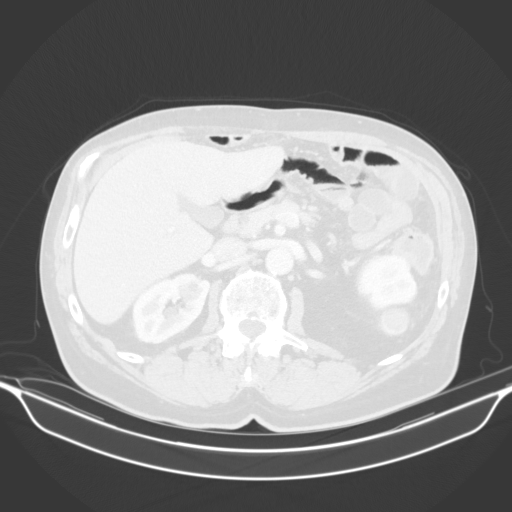
[im 59/94  soft-tissue]
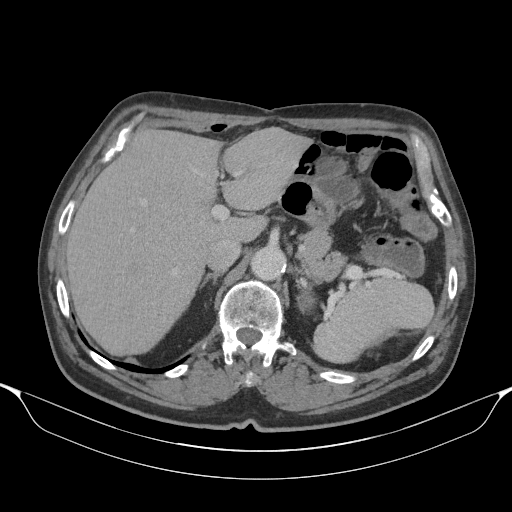
[im 59/94  lung]
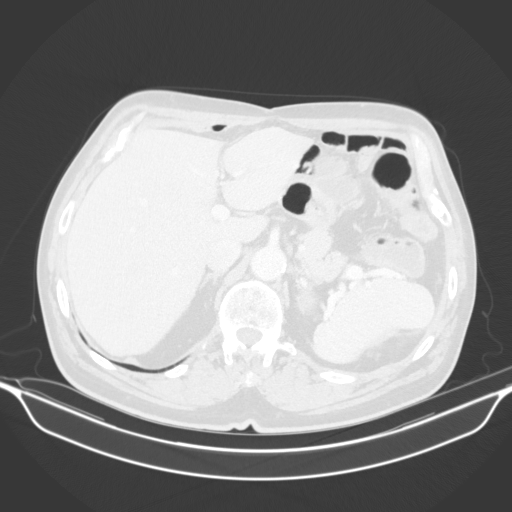
[im 70/94  soft-tissue]
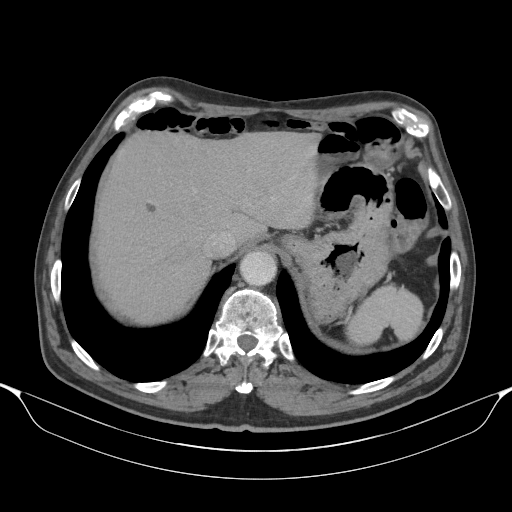
[im 70/94  lung]
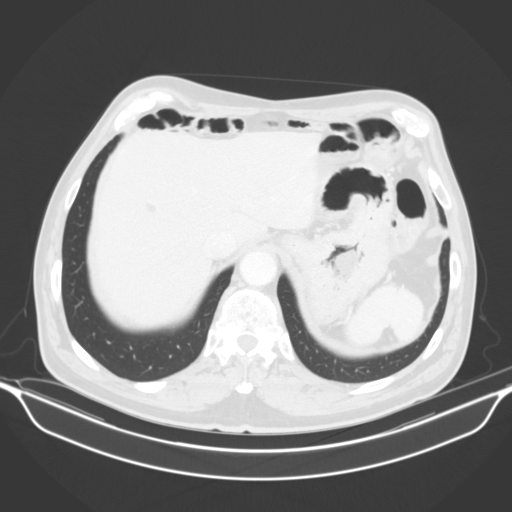
[im 82/94  soft-tissue]
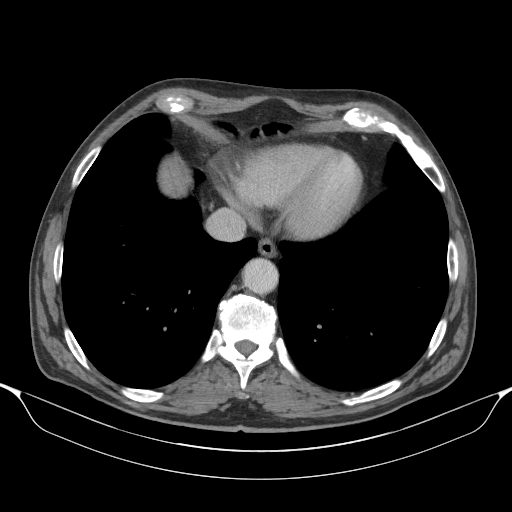
[im 82/94  lung]
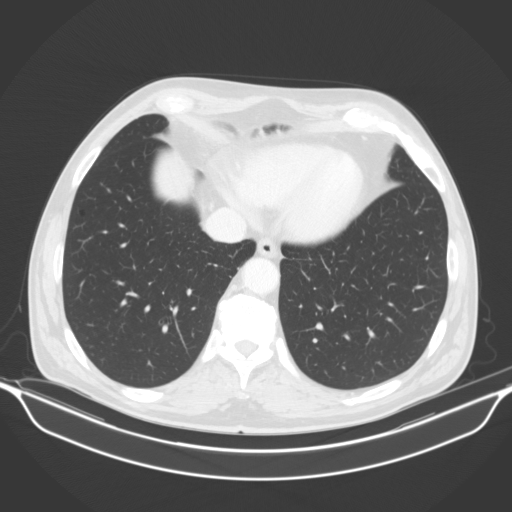

[Series 9: coronal · coronal · 0.63mm/px · 2 of 170 slices shown, 3 images]
[im 57/170  soft-tissue]
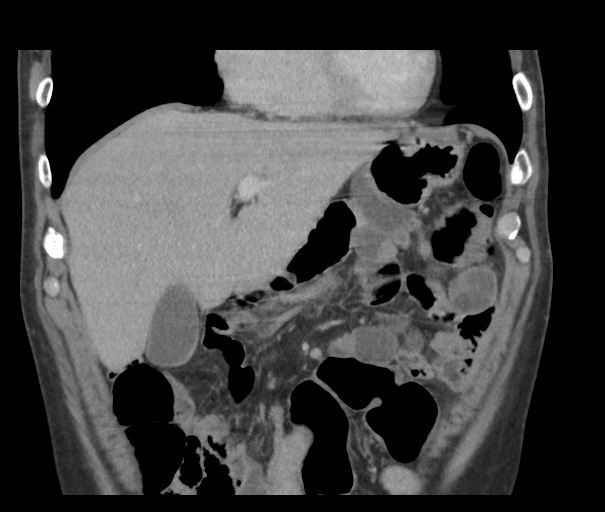
[im 57/170  bone]
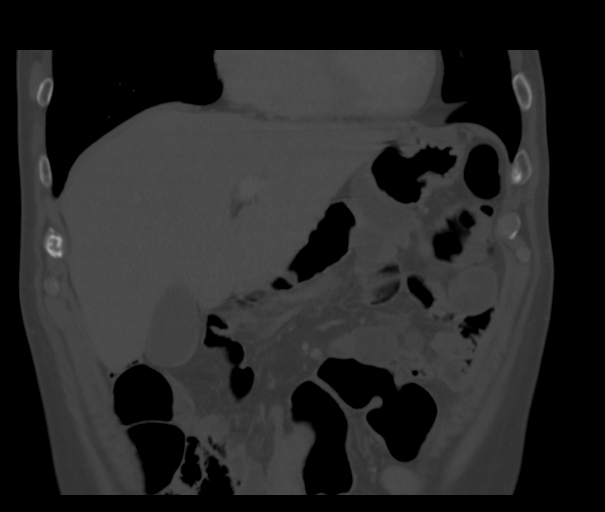
[im 113/170  soft-tissue]
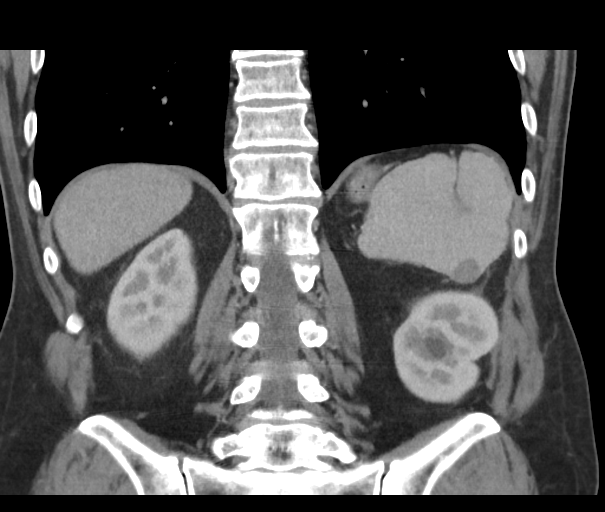

[12 of 46 positions shown; findings below may reference images not displayed]

FINDINGS: LUNG BASES: Lung bases are clear. No pleural effusions. 
HEPATOBILIARY: Small cyst identified in the liver. No suspicious mass seen. No 
gallstones. 
SPLEEN: Normal in size. 
PANCREAS: No ductal dilatation or mass.   
ADRENALS: No mass. 
KIDNEYS: Postoperative changes involving the left kidney. No complication seen. 
No mass. Simple cyst identified. Normal appearance the right kidney with a small 
simple cyst. 
LYMPH NODES: No adenopathy. 
STOMACH, SMALL BOWEL AND COLON: No bowel wall thickening or obstruction. 
VASCULAR STRUCTURES: Atherosclerotic changes without aneurysmal dilatation.  
MUSCULOSKELETAL: No acute osseous abnormality. Scattered degenerative changes.  
ADDITIONAL FINDINGS: None.
IMPRESSION: Postoperative changes left kidney. No complication identified. No evidence of 
recurrent malignancy noted on this examination. 
Atherosclerotic changes and degenerative changes. 
RADIATION DOSE REDUCTION: All CT scans are performed using radiation dose 
reduction techniques, when applicable.  Technical factors are evaluated and 
adjusted to ensure appropriate moderation of exposure.  Automated dose 
management technology is applied to adjust the radiation doses to minimize 
exposure while achieving diagnostic quality images.

## 2022-01-21 IMAGING — MR MRI CERVICAL SPINE WITHOUT CONTRAST
4 of 8 series · 9 of 48 positions shown · IV contrast (gadolinium)
Comparison: None.

________________________________________________________________________________________________ 
MRI CERVICAL SPINE WITHOUT CONTRAST, 01/21/2022 [DATE]: 
CLINICAL INDICATION: Radiculopathy, cervical region.
TECHNIQUE: Multiplanar, multiecho position MR images of the cervical spine were 
performed without intravenous gadolinium enhancement. Patient was scanned on a 
1.5T magnet.

[Series 101: survey · axial · 10.0mm · 1.25mm/px · z∈[-6,+200]mm · 2 of 10 slices shown]
[im 1/10]
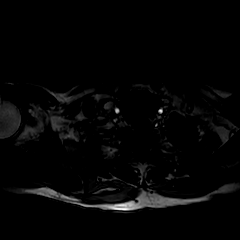
[im 10/10]
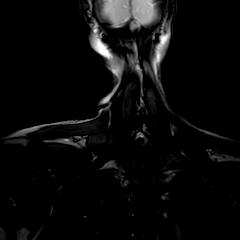

[Series 201: t2w_cor-surv · coronal · 5.0mm · 0.69mm/px · 1 of 7 slices shown]
[im 1/7]
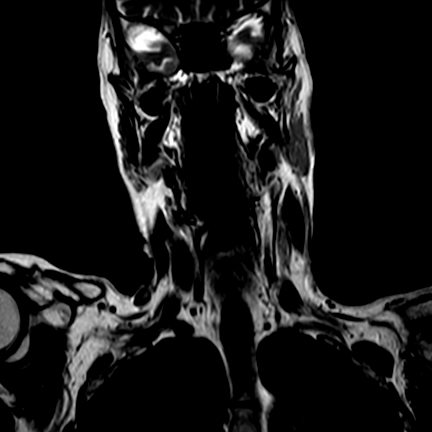

[Series 301: t1_sag · sagittal · 3.0mm · 0.34mm/px · 3 of 16 slices shown]
[im 1/16]
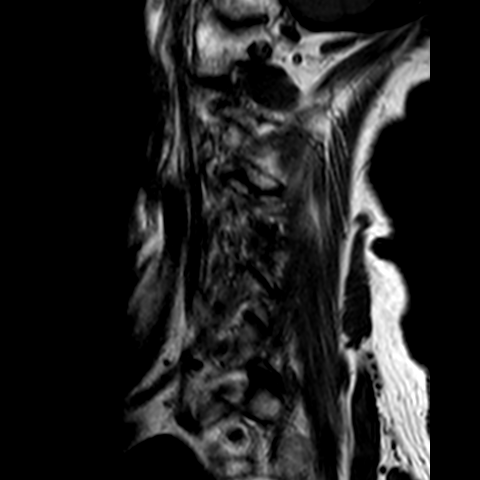
[im 8/16]
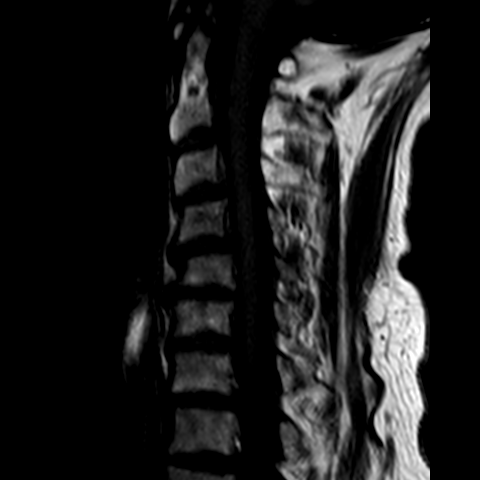
[im 16/16]
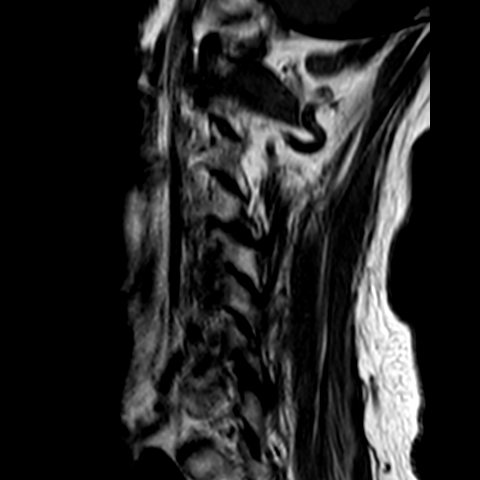

[Series 402: v3d spine axial · axial · 1.0mm · 0.17mm/px · z∈[-53,+54]mm · 3 of 50 slices shown]
[im 6/50]
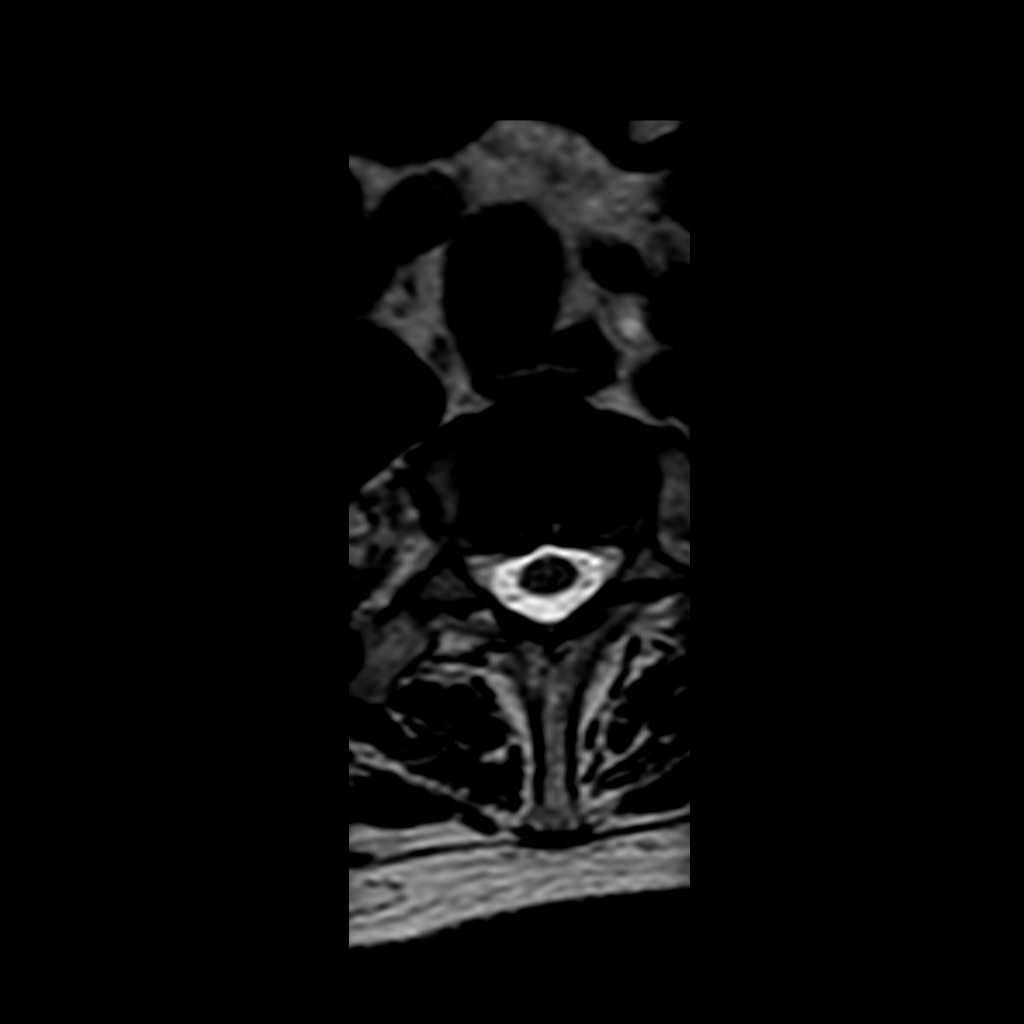
[im 28/50]
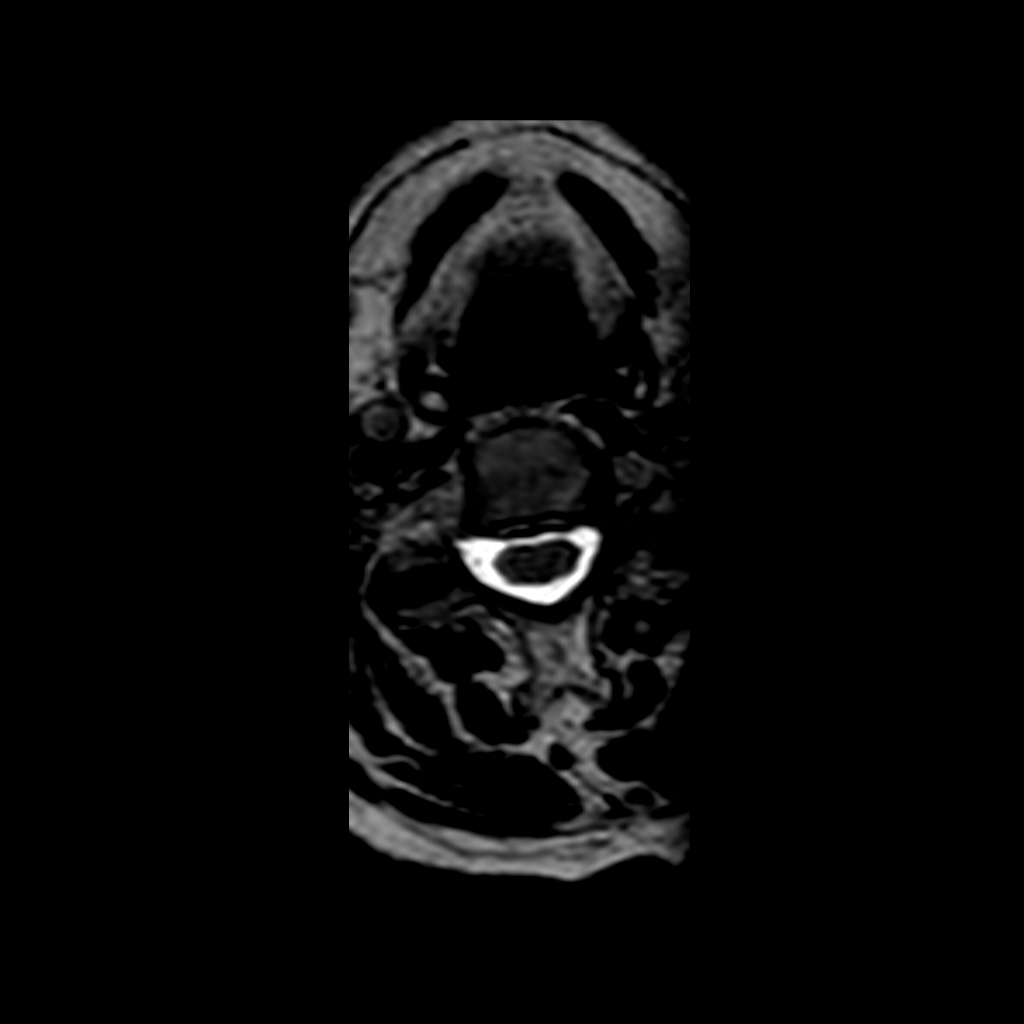
[im 44/50]
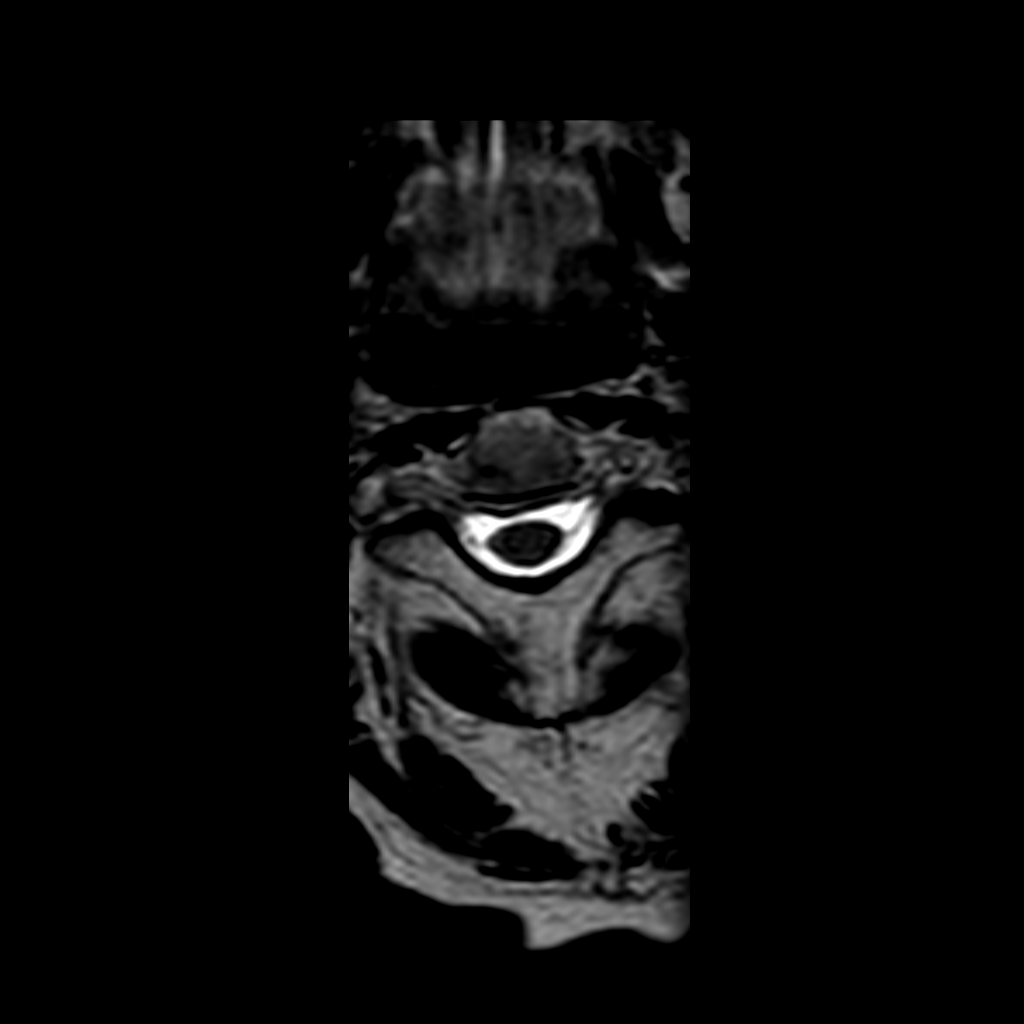

[9 of 48 positions shown; findings below may reference images not displayed]

FINDINGS: -------------------------------------------------------------------------------- 
----------------- 
GENERAL: 
ALIGNMENT: Straightening to mild reversal of normal cervical lordosis. Mild 
anterolisthesis of C3 on C4, C4 on C5, C7 on T1. 
VERTEBRAL BODY HEIGHT: Normal.  
MARROW SIGNAL: No focal suspect signal abnormality. 
CORD SIGNAL: Normal.  
ADDITIONAL FINDINGS: Mega cisterna magna versus arachnoid cyst in the posterior 
fossa. 
-------------------------------------------------------------------------------- 
---------------- 
SEGMENTAL: 
CRANIOCERVICAL JUNCTION: No significant stenosis. 
C2-C3: No significant central canal or neural foraminal narrowing. 
C3-C4: Right uncovertebral joint hypertrophy. Extensive right facet hypertrophy. 
Mild uncovering of the disc space. No significant central canal narrowing. No 
significant left foraminal narrowing. Severe right neural foraminal narrowing. 
C4-C5: Mild uncovering of the disc space with disc osteophyte complex. Left 
uncovertebral joint hypertrophy. Extensive left facet hypertrophy. Mild central 
canal narrowing. No significant right neural foraminal narrowing. Moderate left 
neural foraminal narrowing. 
C5-C6: Disc osteophyte complex with bilateral uncovertebral joint hypertrophy. 
Mild deformity of left ventral cord. Mild central canal narrowing. No 
significant right neural foraminal narrowing. Moderate to severe left neural 
foraminal narrowing. 
C6-C7: No significant central canal or neural foraminal narrowing. 
C7-T1: Uncovering of the disc space with disc bulge. Right subarticular shallow 
disc herniation. Mild right subarticular recess narrowing. No significant neural 
foraminal narrowing. 
-------------------------------------------------------------------------------- 
---------------
IMPRESSION: 1.  Discogenic/degenerative changes as above. 
2.  Worst level(s) of central canal narrowing: C5-C6 (deformity of the left 
ventral cord). 
3.  Worst level(s) of neural foraminal narrowing: C3-C4 (severe right), C5-C6 
(moderate to severe left).

## 2022-05-09 IMAGING — MR MRI LUMBAR SPINE WITHOUT CONTRAST
5 of 8 series · 10 of 48 positions shown · IV contrast (gadolinium)
Comparison: None.

________________________________________________________________________________________________ 
MRI LUMBAR SPINE WITHOUT CONTRAST, 05/09/2022 [DATE]: 
CLINICAL INDICATION: Bilateral leg and buttock pain. Bilateral leg numbness.
TECHNIQUE: Multiplanar, multiecho position MR images of the lumbar spine were 
performed without intravenous gadolinium enhancement. Patient was scanned on a 
1.5T magnet.

[Series 101: survey · axial · 10.0mm · 1.25mm/px · 1 of 10 slices shown]
[im 1/10]
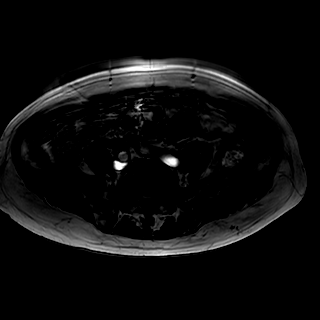

[Series 201: t2w_cor-surv · coronal · 6.0mm · 0.62mm/px · 1 of 10 slices shown]
[im 1/10]
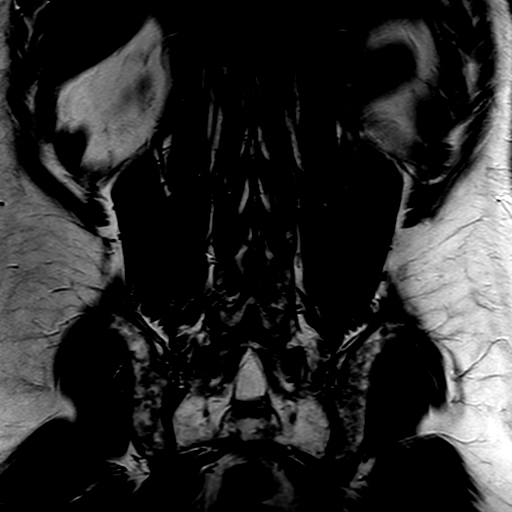

[Series 301: t1_tse_sag · sagittal · 4.0mm · 0.45mm/px · 3 of 18 slices shown]
[im 1/18]
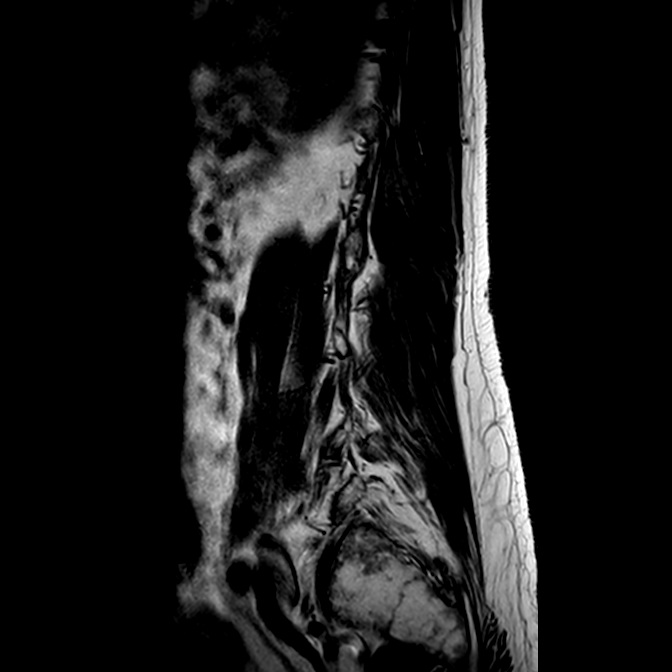
[im 9/18]
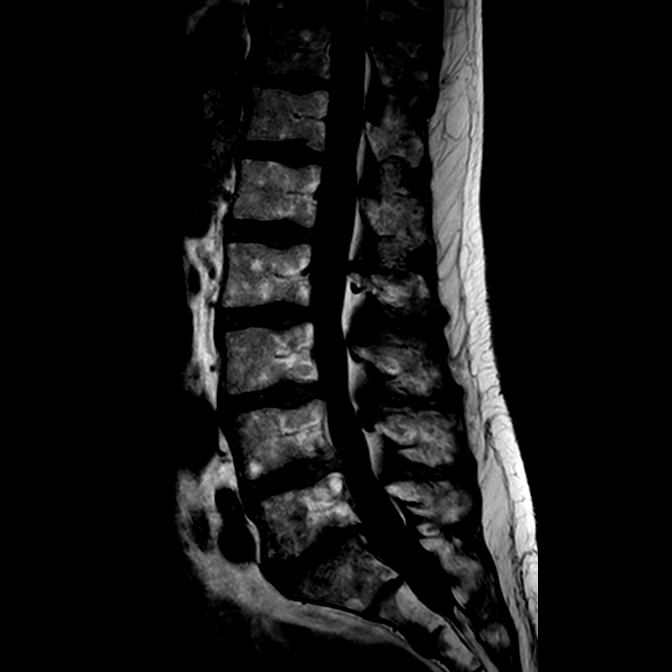
[im 18/18]
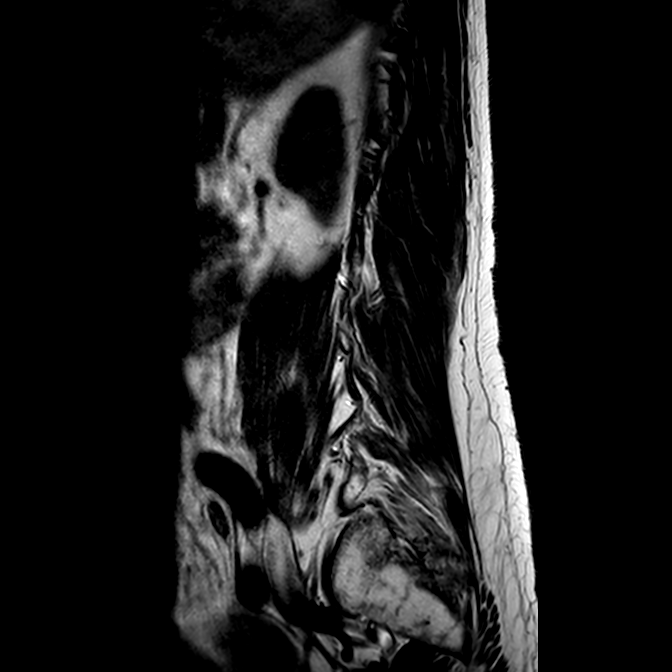

[Series 402: (id)_mdixon_tse · sagittal · 4.0mm · 0.39mm/px · 3 of 18 slices shown]
[im 1/18]
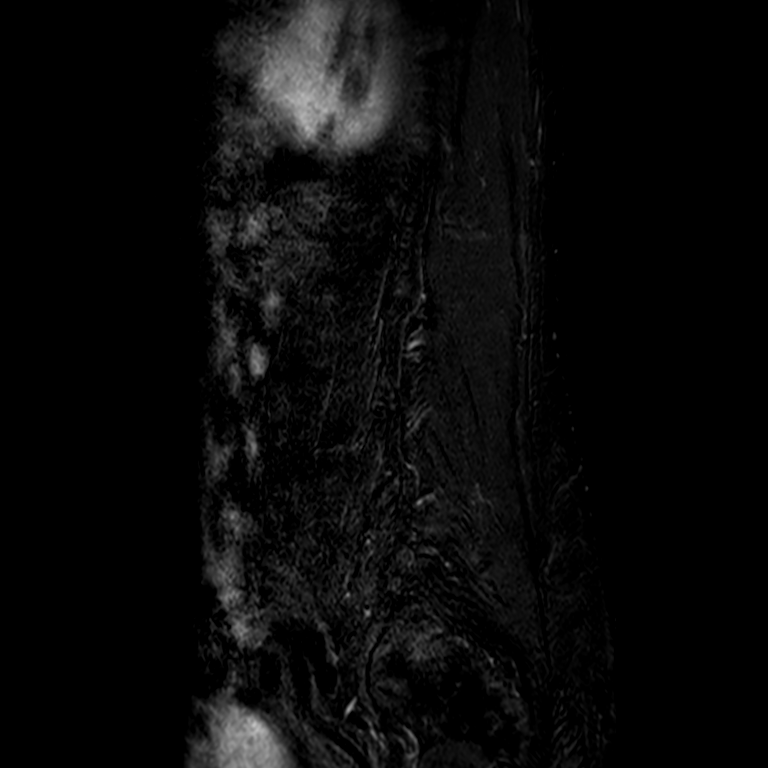
[im 9/18]
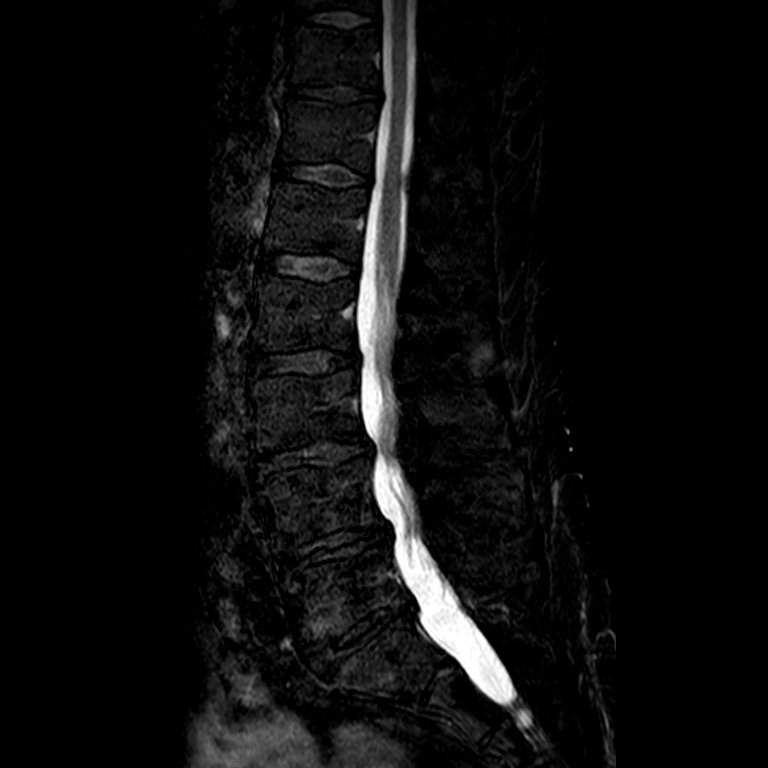
[im 18/18]
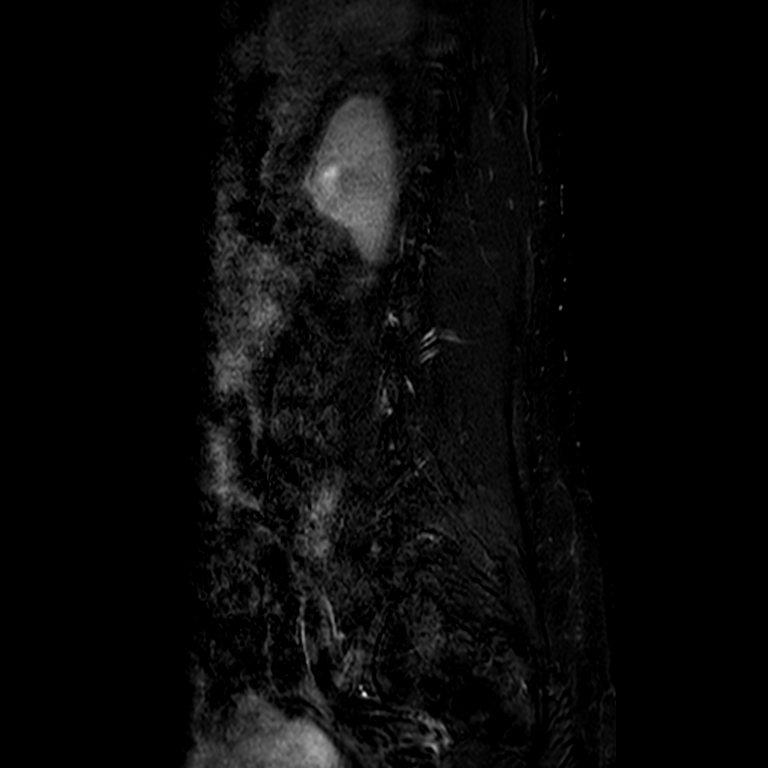

[Series 403: st2w_mdixon_tse · sagittal · 4.0mm · 0.39mm/px · 2 of 18 slices shown]
[im 1/18]
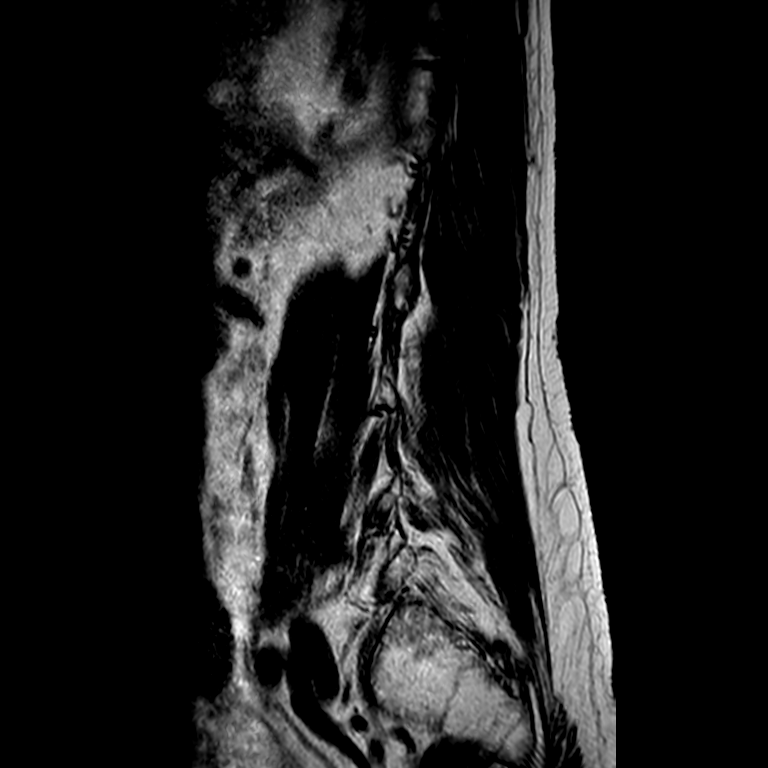
[im 9/18]
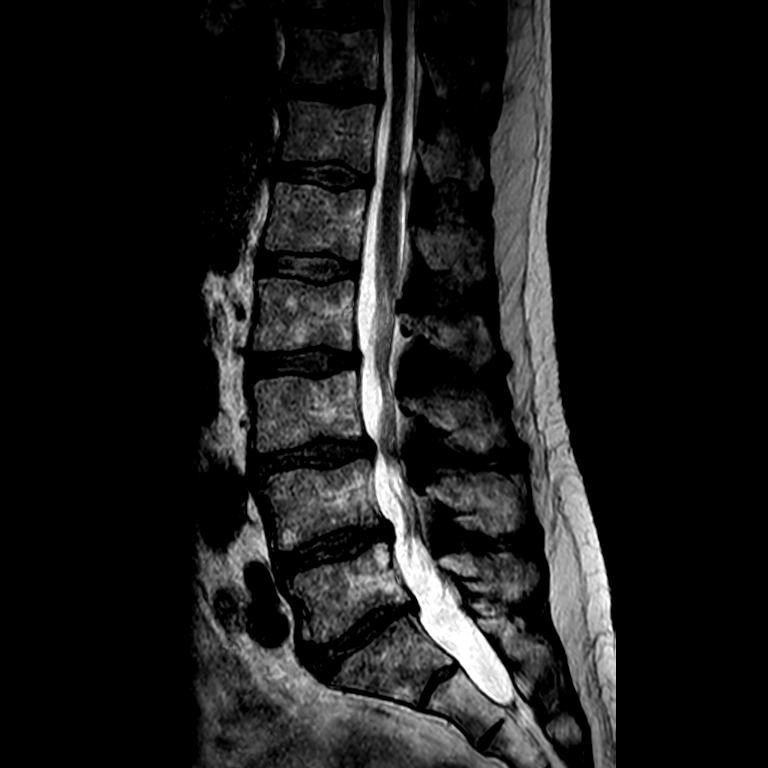

[10 of 48 positions shown; findings below may reference images not displayed]

FINDINGS: -------------------------------------------------------------------------------- 
------ 
GENERAL: 
Nomenclature is based on 5 lumbar type vertebral bodies.     
ALIGNMENT: Mild anterolisthesis of L2 on L3, L3 on L4. Congenital short pedicles 
in the lower lumbar spine. 
VERTEBRAL BODY HEIGHT: Normal.  
MARROW SIGNAL: No focal suspect signal abnormality. 
CORD SIGNAL: Normal distal spinal cord and cauda equina.  Conus terminates at 
L2. 
ADDITIONAL FINDINGS: None. 
Modic I-II: None. 
Ligamentum Flavum > 2.5 mm: All levels. 
-------------------------------------------------------------------------------- 
------ 
SEGMENTAL: 
T12-L1: Left facet hypertrophy. Trace disc bulge. No significant central canal 
narrowing.  No significant right neural foraminal narrowing. No significant left 
neural foraminal narrowing.  
L1-L2: No significant central canal narrowing.  No significant right neural 
foraminal narrowing. No significant left neural foraminal narrowing.  
L2-L3: Disc bulge with bilateral facet and ligamentum flavum hypertrophy. No 
significant central canal narrowing.  No significant right neural foraminal 
narrowing. No significant left neural foraminal narrowing.  
L3-L4: Disc bulge with bilateral facet hypertrophy and bilateral ligamentum 
flavum hypertrophy. Mild central canal and bilateral subarticular recess 
narrowing.  No significant right neural foraminal narrowing. No significant left 
neural foraminal narrowing.  
L4-L5: Disc bulge with bilateral facet hypertrophy. No significant central canal 
narrowing. Moderate left and mild right subarticular recess narrowing.  Mild 
right neural foraminal narrowing. Mild left neural foraminal narrowing.  
L5-S1: Disc bulge with bilateral facet hypertrophy. No significant central canal 
narrowing.  Moderate right neural foraminal narrowing. Moderate left neural 
foraminal narrowing.  
-------------------------------------------------------------------------------- 
------
IMPRESSION: 1.  Discogenic/degenerative changes as above. 
2.  Worst level(s) of central canal/subarticular recess narrowing: L4-L5 
(moderate left subarticular recess) 
3.  Worst level(s) of neural foraminal narrowing: L5-S1 (moderate bilateral)
